# Patient Record
Sex: Female | Born: 1960 | Race: White | Hispanic: No | Marital: Married | Smoking: Former smoker
Health system: Southern US, Community
[De-identification: ages and names within clinical notes are randomized; demographics above are authoritative.]

## PROBLEM LIST (undated history)

## (undated) DIAGNOSIS — H269 Unspecified cataract: Secondary | ICD-10-CM

## (undated) DIAGNOSIS — J45909 Unspecified asthma, uncomplicated: Secondary | ICD-10-CM

## (undated) DIAGNOSIS — T753XXA Motion sickness, initial encounter: Secondary | ICD-10-CM

## (undated) DIAGNOSIS — M1990 Unspecified osteoarthritis, unspecified site: Secondary | ICD-10-CM

## (undated) DIAGNOSIS — T7840XA Allergy, unspecified, initial encounter: Secondary | ICD-10-CM

## (undated) HISTORY — DX: Allergy, unspecified, initial encounter: T78.40XA

## (undated) HISTORY — PX: HEMATOMA EVACUATION: SHX5118

## (undated) HISTORY — PX: EYE SURGERY: SHX253

## (undated) HISTORY — PX: NASAL SINUS SURGERY: SHX719

## (undated) HISTORY — DX: Unspecified cataract: H26.9

---

## 2011-05-13 ENCOUNTER — Ambulatory Visit: Payer: Self-pay | Admitting: Internal Medicine

## 2012-09-20 ENCOUNTER — Ambulatory Visit: Payer: Self-pay

## 2013-10-09 ENCOUNTER — Ambulatory Visit: Payer: Self-pay

## 2014-11-19 ENCOUNTER — Ambulatory Visit: Payer: Self-pay

## 2016-10-19 ENCOUNTER — Encounter: Payer: Self-pay | Admitting: Medical Oncology

## 2016-10-19 ENCOUNTER — Emergency Department
Admission: EM | Admit: 2016-10-19 | Discharge: 2016-10-19 | Disposition: A | Discharge status: Home / Self Care | Payer: BLUE CROSS/BLUE SHIELD | Attending: Emergency Medicine | Admitting: Emergency Medicine

## 2016-10-19 ENCOUNTER — Emergency Department: Payer: BLUE CROSS/BLUE SHIELD

## 2016-10-19 DIAGNOSIS — S93409A Sprain of unspecified ligament of unspecified ankle, initial encounter: Secondary | ICD-10-CM

## 2016-10-19 DIAGNOSIS — W1839XA Other fall on same level, initial encounter: Secondary | ICD-10-CM | POA: Diagnosis not present

## 2016-10-19 DIAGNOSIS — S93402A Sprain of unspecified ligament of left ankle, initial encounter: Secondary | ICD-10-CM | POA: Insufficient documentation

## 2016-10-19 DIAGNOSIS — Y9289 Other specified places as the place of occurrence of the external cause: Secondary | ICD-10-CM | POA: Insufficient documentation

## 2016-10-19 DIAGNOSIS — Y9389 Activity, other specified: Secondary | ICD-10-CM | POA: Diagnosis not present

## 2016-10-19 DIAGNOSIS — Y999 Unspecified external cause status: Secondary | ICD-10-CM | POA: Diagnosis not present

## 2016-10-19 DIAGNOSIS — S99912A Unspecified injury of left ankle, initial encounter: Secondary | ICD-10-CM | POA: Diagnosis present

## 2016-10-19 MED ORDER — NAPROXEN 500 MG PO TBEC: 500.0000 mg | DELAYED_RELEASE_TABLET | Freq: Two times a day (BID) | ORAL | 2 refills | Status: AC

## 2016-10-19 NOTE — ED Notes (Signed)
See triage note.. States she stepped off sidewalk  Twisted left ankle/foot couple of days ago..  States she is able to walk w/o diff  Positive pulses note

## 2016-10-19 NOTE — ED Triage Notes (Signed)
Pt twisted her left ankle Friday on a sidewalk, since then has been having pain and swelling.

## 2016-10-19 NOTE — ED Provider Notes (Signed)
[name]  CSN: [id number] Arrival date & time: 10/19/16  [id number]   History   Chief Complaint Chief Complaint  Patient presents with  . Ankle Pain    HPI  Alicia Huber is a 55 y.o. female presenting with 4/10 dull and burning left ankle pain along the 2nd through 5th metatarsals. 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 Denies previous trauma to ankle. Denies trauma to back. Aleve was used for relief. Denies aggravating factors. Patient states that she is currently experiencing "no pain".    No past surgical history on file.  OB History    No data available       Home Medications    Prior to Admission medications   Medication Sig Start Date End Date Taking? Authorizing Provider  naproxen (EC NAPROSYN) 500 MG EC tablet Take 1 tablet (500 mg total) by mouth 2 (two) times daily with a meal. 10/19/16 10/19/17  Lannie Fields, PA-C    Family History No family history on file.  Social History Social History  Substance Use Topics  . Smoking status: Not on file  . Smokeless tobacco: Not on file  . Alcohol use Not on file     Allergies   Codeine   Review of Systems Review of Systems Constitutional: No fever/chills Eyes: No visual changes. ENT: No sore throat. Cardiovascular: Denies chest pain. Respiratory: Denies shortness of breath. Gastrointestinal: No abdominal pain.  No nausea, no vomiting.  No diarrhea.  No constipation. Genitourinary: Negative for dysuria. Musculoskeletal: Negative for back pain. Skin: Negative for rash. Neurological: Negative focal weakness or numbness.  10-point ROS otherwise negative.  Physical Exam Updated Vital Signs BP 129/77 (BP Location: Left Arm)   Pulse 69   Temp 97.6 F (36.4 C) (Oral)   Resp 18   Ht 5\' 9"  (1.753 m)   Wt 108.9 kg   SpO2 97%   BMI 35.44 kg/m   Physical Exam  Constitutional: She is oriented to  person, place, and time. She appears well-developed and well-nourished.  HENT:  Head: Normocephalic and atraumatic.  Eyes: EOM are normal. Pupils are equal, round, and reactive to light.  Neck: Normal range of motion.  Cardiovascular: Normal rate, regular rhythm, normal heart sounds and intact distal pulses.   Pulmonary/Chest: Effort normal and breath sounds normal.  Musculoskeletal: Normal range of motion. She exhibits no edema.  Full flexion at left ankle. Patient experiences pain with extension at left ankle.Capable of inversion and eversion at left ankle. Moderate pain to palpation along the left 2nd through 5th metatarsals. Patient has intact sensation along L4, L5, S1 and S2 dermatomes.   Neurological: She is alert and oriented to person, place, and time. She displays normal reflexes. No cranial nerve deficit or sensory deficit. She exhibits normal muscle tone. Coordination normal.  Skin: Skin is warm and dry. Capillary refill takes less than 2 seconds. No rash noted. No erythema.  Psychiatric: She has a normal mood and affect. Her behavior is normal. Judgment and thought content normal.    ED Treatments / Results  Labs (all labs ordered are listed, but only abnormal results are displayed) Labs Reviewed - No data to display  EKG  EKG Interpretation None       Radiology Dg Foot Complete Left  Result Date: 10/19/2016 CLINICAL DATA:  Pain following fall EXAM: LEFT FOOT - COMPLETE 3+ VIEW COMPARISON:  None. FINDINGS: Frontal, oblique, and  lateral views were obtained. There is no fracture or dislocation. The joints appear normal. There is a bone island along the lateral aspect of the distal portion of the first proximal phalanx. There is a spur arising from the inferior calcaneus. There is an accessory ossicle lateral to the cuboid, an anatomic variant. IMPRESSION: Inferior calcaneal spur. No appreciable arthropathy. No fracture or dislocation. Electronically Signed   By: Lowella Grip III M.D.   On: 10/19/2016 09:29    Procedures Procedures (including critical care time)  Medications Ordered in ED Medications - No data to display   Initial Impression / Assessment and Plan / ED Course  I have reviewed the triage vital signs and the nursing notes.  Pertinent labs & imaging results that were available during my care of the patient were reviewed by me and considered in my medical decision making (see chart for details).  Clinical Course    Assessment: Left Ankle Sprain. Patient has moderate pain to palpation consistent with left ankle sprain. DG Foot Complete Left - no contributory findings.   Plan: Patient was advised to initiate short-term treatment with rest, ice, compression and elevation. Naproxen 500 mg orally twice daily prescribed for left ankle pain.   Final Clinical Impressions(s) / ED Diagnoses   Final diagnoses:  Sprain of ankle, unspecified laterality, unspecified ligament, initial encounter    New Prescriptions New Prescriptions   NAPROXEN (EC NAPROSYN) 500 MG EC TABLET    Take 1 tablet (500 mg total) by mouth 2 (two) times daily with a meal.     Lannie Fields, PA-C 10/19/16 1029    Earleen Newport, MD 10/19/16 1112

## 2016-12-25 ENCOUNTER — Ambulatory Visit: Payer: Self-pay | Admitting: Podiatry

## 2017-01-20 ENCOUNTER — Ambulatory Visit: Payer: BLUE CROSS/BLUE SHIELD | Attending: Oncology | Admitting: *Deleted

## 2017-01-20 ENCOUNTER — Ambulatory Visit
Admission: RE | Admit: 2017-01-20 | Discharge: 2017-01-20 | Disposition: A | Discharge status: Home / Self Care | Payer: BLUE CROSS/BLUE SHIELD | Source: Ambulatory Visit | Attending: Oncology | Admitting: Oncology

## 2017-01-20 VITALS — BP 129/66 | HR 67 | Temp 97.5°F | Resp 18 | Ht 69.69 in | Wt 224.4 lb

## 2017-01-20 DIAGNOSIS — Z0000 Encounter for general adult medical examination without abnormal findings: Secondary | ICD-10-CM

## 2017-01-20 NOTE — Patient Instructions (Signed)
HPV Test The human papillomavirus (HPV) test is used to look for high-risk types of HPV infection. HPV is a group of about 100 viruses. Many of these viruses cause growths on, in, or around the genitals. Most HPV viruses cause infections that usually go away without treatment. However, HPV types 6, 11, 16, and 18 are considered high-risk types of HPV that can increase your risk of cancer of the cervix or anus if the infection is left untreated. An HPV test identifies the DNA (genetic) strands of the HPV infection, so it is also referred to as the HPV DNA test. Although HPV is found in both males and females, the HPV test is only used to screen for increased cancer risk in females:  With an abnormal Pap test.  After treatment of an abnormal Pap test.  Between the ages of 30 and 65.  After treatment of a high-risk HPV infection. The HPV test may be done at the same time as a pelvic exam and Pap test in females over the age of 30. Both the HPV test and Pap test require a sample of cells from the cervix. How do I prepare for this test?  Do not douche or take a bath for 24-48 hours before the test or as directed by your health care provider.  Do not have sex for 24-48 hours before the test or as directed by your health care provider.  You may be asked to reschedule the test if you are menstruating.  You will be asked to urinate before the test. What do the results mean? It is your responsibility to obtain your test results. Ask the lab or department performing the test when and how you will get your results. Talk with your health care provider if you have any questions about your results. Your result will be negative or positive. Meaning of Negative Test Results  A negative HPV test result means that no HPV was found, and it is very likely that you do not have HPV. Meaning of Positive Test Results  A positive HPV test result indicates that you have HPV.  If your test result shows the presence  of any high-risk HPV strains, you may have an increased risk of developing cancer of the cervix or anus if the infection is left untreated.  If any low-risk HPV strains are found, you are not likely to have an increased risk of cancer. Discuss your test results with your health care provider. He or she will use the results to make a diagnosis and determine a treatment plan that is right for you. Talk with your health care provider to discuss your results, treatment options, and if necessary, the need for more tests. Talk with your health care provider if you have any questions about your results. This information is not intended to replace advice given to you by your health care provider. Make sure you discuss any questions you have with your health care provider. Document Released: 12/25/2004 Document Revised: 08/05/2016 Document Reviewed: 04/17/2014 Elsevier Interactive Patient Education  2017 Elsevier Inc.  Gave patient hand-out, Women Staying Healthy, Active and Well from [facility], with education on breast health, pap smears, heart and colon health.  

## 2017-01-20 NOTE — Progress Notes (Signed)
Subjective:     Patient ID: Alicia Huber, female   DOB: 1961/12/12, 56 y.o.   MRN: [id number]  HPI   Review of Systems     Objective:   Physical Exam  Pulmonary/Chest: Right breast exhibits no inverted nipple, no mass, no nipple discharge, no skin change and no tenderness. Left breast exhibits no mass, no nipple discharge, no skin change and no tenderness. Breasts are symmetrical.  Abdominal: There is no splenomegaly or hepatomegaly.  Genitourinary: Rectal exam shows no external hemorrhoid and no mass. No labial fusion. There is no rash, tenderness, lesion or injury on the right labia. There is no rash, tenderness, lesion or injury on the left labia. Cervix exhibits no motion tenderness, no discharge and no friability. Right adnexum displays no mass, no tenderness and no fullness. Left adnexum displays no mass, no tenderness and no fullness. No erythema, tenderness or bleeding in the vagina. No foreign body in the vagina. No signs of injury around the vagina. No vaginal discharge found.       Assessment:     56 year old White female returns to [facility] for annual screening.  Clinical breast exam unremarkable.  Taught self breast awareness.  Last pap was in 2013.  Specimen collected for pap smear.  Patient has been screened for eligibility.  She does not have any insurance, Medicare or Medicaid.  She also meets financial eligibility.  Hand-out given on the Affordable Care Act.     Plan:     Screening mammogram ordered.  Specimen for pap sent to the lab.  Will follow up per [facility] protocol.

## 2017-01-24 LAB — PAP LB AND HPV HIGH-RISK
HPV, high-risk: NEGATIVE
PAP SMEAR COMMENT: 0

## 2017-02-04 ENCOUNTER — Encounter: Payer: Self-pay | Admitting: *Deleted

## 2017-02-04 NOTE — Progress Notes (Signed)
Letter mailed to inform patient of her normal mammogram and pap smear.  She is to follow-up in one year with annual screening.  Next pap due in 5 years.  [facility] to [place].

## 2018-03-23 ENCOUNTER — Ambulatory Visit: Payer: Self-pay | Attending: Oncology

## 2018-03-23 ENCOUNTER — Ambulatory Visit
Admission: RE | Admit: 2018-03-23 | Discharge: 2018-03-23 | Disposition: A | Discharge status: Home / Self Care | Payer: Self-pay | Source: Ambulatory Visit | Attending: Oncology | Admitting: Oncology

## 2018-03-23 VITALS — BP 116/77 | HR 78 | Temp 97.2°F | Ht 69.0 in | Wt 227.0 lb

## 2018-03-23 DIAGNOSIS — Z0000 Encounter for general adult medical examination without abnormal findings: Secondary | ICD-10-CM

## 2018-03-23 NOTE — Progress Notes (Signed)
Letter mailed from [facility] to notify of normal mammogram results.  Patient to return in one year for annual screening.  Copy to [facility]. 

## 2018-03-23 NOTE — Progress Notes (Signed)
Subjective:     Patient ID: Alicia Huber, female   DOB: 1961-08-09, 57 y.o.   MRN: [id number]  HPI   Review of Systems     Objective:   Physical Exam     Assessment:     See note    Plan:     See note

## 2018-03-23 NOTE — Progress Notes (Signed)
Subjective:     Patient ID: Alicia Huber, female   DOB: 02/19/1961, 57 y.o.   MRN: [id number]  HPI   Review of Systems     Objective:   Physical Exam  Pulmonary/Chest: Right breast exhibits no inverted nipple, no mass, no nipple discharge, no skin change and no tenderness. Left breast exhibits no inverted nipple, no mass, no nipple discharge, no skin change and no tenderness. Breasts are symmetrical.       Assessment:     57 year old patient presents for [facility] clinic visit.  Patient screened, and meets [facility] eligibility.  Patient does not have insurance, Medicare or Medicaid.  Handout given on Affordable Care Act. Instructed patient on breast self awareness using teach back method.  CBE unremarkable.  Patient voices concerns about menopausal symptoms, vaginal dryness.  Given information on pelvic floor therapist, and recommendations for menopausal symptoms.     Plan:     Sent for bilateral screening mammogram.

## 2018-05-24 ENCOUNTER — Ambulatory Visit: Payer: Self-pay | Admitting: Podiatry

## 2018-06-08 ENCOUNTER — Ambulatory Visit: Payer: No Typology Code available for payment source | Admitting: Podiatry

## 2018-06-08 ENCOUNTER — Other Ambulatory Visit: Payer: Self-pay | Admitting: Podiatry

## 2018-06-08 ENCOUNTER — Encounter: Payer: Self-pay | Admitting: Podiatry

## 2018-06-08 ENCOUNTER — Telehealth: Payer: Self-pay | Admitting: *Deleted

## 2018-06-08 ENCOUNTER — Ambulatory Visit (INDEPENDENT_AMBULATORY_CARE_PROVIDER_SITE_OTHER): Payer: No Typology Code available for payment source

## 2018-06-08 VITALS — BP 121/74 | HR 73 | Resp 16

## 2018-06-08 DIAGNOSIS — M67472 Ganglion, left ankle and foot: Secondary | ICD-10-CM

## 2018-06-08 DIAGNOSIS — M6740 Ganglion, unspecified site: Secondary | ICD-10-CM

## 2018-06-08 DIAGNOSIS — M898X7 Other specified disorders of bone, ankle and foot: Secondary | ICD-10-CM

## 2018-06-08 NOTE — Telephone Encounter (Signed)
"  I just left Dr. Stephenie Acres office.  He told me to call you to schedule my surgery.  I'd like to do it in August."  I don't have your information at this time.  I don't know what procedures you will be having nor do I know how long your procedure will be.  He normally brings the information to me on Thursdays.  "I will call tomorrow.  I was just trying to get a date before he got filled up.  I have certain days I can do it on."  I'm sorry, I will know all information needed on tomorrow.

## 2018-06-08 NOTE — Progress Notes (Addendum)
  Subjective:  Patient ID: Alicia Huber, female    DOB: 1961-05-04,  MRN: [id number] HPI Twisted foot about a few weeks ago and noticed that there was a cyst that formed and has since become painful.  57 y.o. female presents with the above complaint.   ROS: Denies fever chills nausea vomiting muscle aches pains calf pain back pain chest pain shortness of breath.  No past medical history on file. No past surgical history on file.  Current Outpatient Medications:  .  albuterol (PROAIR HFA) 108 (90 Base) MCG/ACT inhaler, , Disp: , Rfl:  .  naproxen sodium (ALEVE) 220 MG tablet, Take 220 mg by mouth., Disp: , Rfl:   Allergies  Allergen Reactions  . Clarithromycin Other (See Comments) and Nausea Only  . Codeine Nausea Only   Review of Systems Objective:   Vitals:   06/08/18 0935  BP: 121/74  Pulse: 73  Resp: 16    General: Well developed, nourished, in no acute distress, alert and oriented x3   Dermatological: Skin is warm, dry and supple bilateral. Nails x 10 are well maintained; remaining integument appears unremarkable at this time. There are no open sores, no preulcerative lesions, no rash or signs of infection present.  Vascular: Dorsalis Pedis artery and Posterior Tibial artery pedal pulses are 2/4 bilateral with immedate capillary fill time. Pedal hair growth present. No varicosities and no lower extremity edema present bilateral.   Neruologic: Grossly intact via light touch bilateral. Vibratory intact via tuning fork bilateral. Protective threshold with Semmes Wienstein monofilament intact to all pedal sites bilateral. Patellar and Achilles deep tendon reflexes 2+ bilateral. No Babinski or clonus noted bilateral.   Musculoskeletal: No gross boney pedal deformities bilateral. No pain, crepitus, or limitation noted with foot and ankle range of motion bilateral. Muscular strength 5/5 in all groups tested bilateral. Painful firm cyst to the dorsum of the left foot at the  tarsometatarsal joint.   Gait: Unassisted, Nonantalgic.    Radiographs:  No acute findings  Assessment & Plan:   Assessment: Soft tissue mass or ganglion cyst dorsal lateral aspect of the fourth fifth tarsometatarsal joints.  Plan: Surgical consent was performed today thorough discussion.  Excision soft tissue tumor dorsal lateral aspect of the left foot we did discuss the possible postop complications which may include but not limited to postop pain bleeding swelling infection recurrence need further surgery overcorrection under correction loss of digit loss of limb loss of life.  She has a Cam walker at home she will bring that on the day of surgery dispensed both oral and written home-going instruction for preop surgery center as well as anesthesia group.  She signed all 3 pages a consent form of follow-up with her in the near future.     Elmin Wiederholt T. [place], [place]

## 2018-06-08 NOTE — Patient Instructions (Signed)
Pre-Operative Instructions  Congratulations, you have decided to take an important step towards improving your quality of life.  You can be assured that the doctors and staff at [facility] will be with you every step of the way.  Here are some important things you should know:  1. Plan to be at the surgery center/hospital at least 1 (one) hour prior to your scheduled time, unless otherwise directed by the surgical center/hospital staff.  You must have a responsible adult accompany you, remain during the surgery and drive you home.  Make sure you have directions to the surgical center/hospital to ensure you arrive on time. 2. If you are having surgery at [facility] or [facility] hospitals, you will need a copy of your medical history and physical form from your family physician within one month prior to the date of surgery. We will give you a form for your primary physician to complete.  3. We make every effort to accommodate the date you request for surgery.  However, there are times where surgery dates or times have to be moved.  We will contact you as soon as possible if a change in schedule is required.   4. No aspirin/ibuprofen for one week before surgery.  If you are on aspirin, any non-steroidal anti-inflammatory medications (Mobic, Aleve, Ibuprofen) should not be taken seven (7) days prior to your surgery.  You make take Tylenol for pain prior to surgery.  5. Medications - If you are taking daily heart and blood pressure medications, seizure, reflux, allergy, asthma, anxiety, pain or diabetes medications, make sure you notify the surgery center/hospital before the day of surgery so they can tell you which medications you should take or avoid the day of surgery. 6. No food or drink after midnight the night before surgery unless directed otherwise by surgical center/hospital staff. 7. No alcoholic beverages 24-hours prior to surgery.  No smoking 24-hours prior or 24-hours after  surgery. 8. Wear loose pants or shorts. They should be loose enough to fit over bandages, boots, and casts. 9. Don't wear slip-on shoes. Sneakers are preferred. 10. Bring your boot with you to the surgery center/hospital.  Also bring crutches or a walker if your physician has prescribed it for you.  If you do not have this equipment, it will be provided for you after surgery. 11. If you have not been contacted by the surgery center/hospital by the day before your surgery, call to confirm the date and time of your surgery. 12. Leave-time from work may vary depending on the type of surgery you have.  Appropriate arrangements should be made prior to surgery with your employer. 13. Prescriptions will be provided immediately following surgery by your doctor.  Fill these as soon as possible after surgery and take the medication as directed. Pain medications will not be refilled on weekends and must be approved by the doctor. 14. Remove nail polish on the operative foot and avoid getting pedicures prior to surgery. 15. Wash the night before surgery.  The night before surgery wash the foot and leg well with water and the antibacterial soap provided. Be sure to pay special attention to beneath the toenails and in between the toes.  Wash for at least three (3) minutes. Rinse thoroughly with water and dry well with a towel.  Perform this wash unless told not to do so by your physician.  Enclosed: 1 Ice pack (please put in freezer the night before surgery)   1 Hibiclens skin cleaner     Pre-op instructions  If you have any questions regarding the instructions, please do not hesitate to call our office.  [place]: [address], [place], [place] [address] -- [phone]  [place]: [address] [place], [place] [address] -- [phone]  [place]: [address]  , [place] [address] -- [phone]  [place]: [address], [address], [place], [place] [address] -- [phone]  Website:  [email or web address] 

## 2018-06-09 NOTE — Telephone Encounter (Signed)
I received your paperwork.  So I am calling you to schedule your surgery date.  Do you have a date in mind?  "I'd like to do it after my vacation to the beach.  I don't want to be in pain on my vacation.  Can he do it on August 16?"  Yes, that date is available.  I will get your surgery scheduled.  Someone from the surgical center will give you a call a day or two prior to your surgery.  They will give you your arrival time.  You can go on-line and register with the surgical center, instructions are in the brochure that was given to you.

## 2018-06-22 ENCOUNTER — Telehealth: Payer: Self-pay | Admitting: *Deleted

## 2018-06-22 NOTE — Telephone Encounter (Addendum)
"  I have a foot surgery scheduled on August 16.  I woke up about 3 am, the other morning and realized that the 16th, I'm out of town.  I'm supposed to be going out to meet up with my sister in [place], [place].  So I apologize greatly but I need to move the foot surgery because I already had the plane ticket scheduled since April.  I apologize for the oversight on my part.  You can reach me on my cell so we can reschedule the foot surgery."  I'm returning your call.  You wanted to reschedule your surgery?  "Yes, I feel so lame."  No worries, it happens to [place] all.  Do you have a date in mind that you would like to reschedule it to?  "What's his next available?"  He can do it the following week, August 23.  "That date will be fine.  Thank you so much."  I called and informed Caren Griffins, scheduler at the surgical center, of the change from 07/29/2018 to 08/05/2018.

## 2018-07-18 ENCOUNTER — Other Ambulatory Visit: Payer: Self-pay | Admitting: Student

## 2018-07-18 DIAGNOSIS — M79662 Pain in left lower leg: Secondary | ICD-10-CM

## 2018-07-26 ENCOUNTER — Ambulatory Visit
Admission: RE | Admit: 2018-07-26 | Discharge: 2018-07-26 | Disposition: A | Discharge status: Home / Self Care | Payer: No Typology Code available for payment source | Source: Ambulatory Visit | Attending: Student | Admitting: Student

## 2018-07-26 DIAGNOSIS — R9389 Abnormal findings on diagnostic imaging of other specified body structures: Secondary | ICD-10-CM | POA: Insufficient documentation

## 2018-07-26 DIAGNOSIS — M79662 Pain in left lower leg: Secondary | ICD-10-CM

## 2018-07-27 ENCOUNTER — Telehealth: Payer: Self-pay | Admitting: *Deleted

## 2018-07-27 NOTE — Telephone Encounter (Addendum)
"  I need my insurance checked for my surgery.  They said noone had contacted them yet."

## 2018-07-29 NOTE — Telephone Encounter (Signed)
I called informed the patient that she has a $5,500 deductible that started over on 07/14/2018.  She said she was aware.  She said she does not know how much she's going to owe for the surgical center nor for the anesthesia.  I asked her if she had called the surgical center to get an estimate.  She said she does not feel like she should have to call around to get this information herself.  She said she's doing all the leg work.  She said we don't want to see her ugly side come out. I told her I would call the surgical center and asked them to call her with an estimate.    I called the surgical center and spoke to [facility].  I informed her that the patient wanted an estimate for the facility fee and for anesthesia.  Renee said she would have someone call her today with that information.

## 2018-08-01 NOTE — Telephone Encounter (Signed)
Patients postop appointments have been canceled. °

## 2018-08-01 NOTE — Telephone Encounter (Signed)
"  I'm scheduled for surgery on Friday, August 23.  I need to cancel it.  I was not aware that my deductible was starting over in August.  I don't have the money right now.  I will probably have to have surgery sometime next year.  I will call back to reschedule later."  I will let Dr. Milinda Pointer know and I will call the surgical center to cancel it.

## 2018-08-10 ENCOUNTER — Encounter: Payer: No Typology Code available for payment source | Admitting: Podiatry

## 2018-08-17 ENCOUNTER — Encounter: Payer: No Typology Code available for payment source | Admitting: Podiatry

## 2019-01-04 ENCOUNTER — Ambulatory Visit (INDEPENDENT_AMBULATORY_CARE_PROVIDER_SITE_OTHER): Payer: No Typology Code available for payment source

## 2019-01-04 ENCOUNTER — Encounter: Payer: Self-pay | Admitting: Podiatry

## 2019-01-04 ENCOUNTER — Ambulatory Visit (INDEPENDENT_AMBULATORY_CARE_PROVIDER_SITE_OTHER): Payer: No Typology Code available for payment source | Admitting: Podiatry

## 2019-01-04 DIAGNOSIS — M722 Plantar fascial fibromatosis: Secondary | ICD-10-CM | POA: Diagnosis not present

## 2019-01-04 MED ORDER — MELOXICAM 15 MG PO TABS: 15.0000 mg | ORAL_TABLET | Freq: Every day | ORAL | 3 refills | Status: DC

## 2019-01-04 MED ORDER — METHYLPREDNISOLONE 4 MG PO TBPK: ORAL_TABLET | ORAL | 0 refills | Status: DC

## 2019-01-04 NOTE — Progress Notes (Signed)
She presents today chief complaint of plantar and lateral right heel pain states is been burning for the past month or so she says the pain is constant she has been wearing inserts and a night splint.  She states that really has not helped.  Objective: Vital signs are stable alert and oriented x3.  Pulses are palpable.  He has no pain on medial lateral compression of the calcaneus that she does have pain with direct palpation medial [place] tubercle of the right heel.  Assessment: Plantar fasciitis.  Plan: Extra Betadine skin prep I injected the medial aspect of the right heel with 20 mg Kenalog 5 mg Marcaine point maximal tenderness.  Start her on methylprednisolone to be followed by meloxicam.  Placed her in plantar fascial brace.  Follow-up with her in 1 month

## 2019-01-04 NOTE — Patient Instructions (Signed)

## 2019-02-01 ENCOUNTER — Ambulatory Visit: Payer: No Typology Code available for payment source | Admitting: Podiatry

## 2019-05-30 ENCOUNTER — Other Ambulatory Visit: Payer: Self-pay | Admitting: Student

## 2019-05-30 DIAGNOSIS — Z1231 Encounter for screening mammogram for malignant neoplasm of breast: Secondary | ICD-10-CM

## 2019-06-08 ENCOUNTER — Other Ambulatory Visit: Payer: Self-pay

## 2019-06-08 ENCOUNTER — Ambulatory Visit
Admission: RE | Admit: 2019-06-08 | Discharge: 2019-06-08 | Disposition: A | Discharge status: Home / Self Care | Payer: No Typology Code available for payment source | Source: Ambulatory Visit | Attending: Student | Admitting: Student

## 2019-06-08 ENCOUNTER — Encounter (INDEPENDENT_AMBULATORY_CARE_PROVIDER_SITE_OTHER): Payer: Self-pay

## 2019-06-08 DIAGNOSIS — Z1231 Encounter for screening mammogram for malignant neoplasm of breast: Secondary | ICD-10-CM

## 2020-03-08 ENCOUNTER — Ambulatory Visit: Payer: No Typology Code available for payment source

## 2020-03-18 ENCOUNTER — Other Ambulatory Visit: Payer: Self-pay

## 2020-03-18 ENCOUNTER — Ambulatory Visit: Payer: No Typology Code available for payment source | Attending: Internal Medicine

## 2020-03-18 DIAGNOSIS — Z23 Encounter for immunization: Secondary | ICD-10-CM

## 2020-03-18 NOTE — Progress Notes (Signed)
   Covid-19 Vaccination Clinic  Name:  TEODORA BASIC    MRN: [id number] DOB: 1961/06/17  03/18/2020  Ms. Matecki was observed post Covid-19 immunization for 15 minutes without incident. She was provided with Vaccine Information Sheet and instruction to access the V-Safe system.   Ms. Takach was instructed to call 911 with any severe reactions post vaccine: Marland Kitchen Difficulty breathing  . Swelling of face and throat  . A fast heartbeat  . A bad rash all over body  . Dizziness and weakness   Immunizations Administered    Name Date Dose VIS Date Route   Pfizer COVID-19 Vaccine 03/18/2020 10:33 AM 0.3 mL 11/24/2019 Intramuscular   Manufacturer: [place]   Lot: [id number]   [place]: [id number]

## 2020-04-10 ENCOUNTER — Ambulatory Visit: Payer: No Typology Code available for payment source | Attending: Internal Medicine

## 2020-04-10 ENCOUNTER — Other Ambulatory Visit: Payer: Self-pay

## 2020-04-10 DIAGNOSIS — Z23 Encounter for immunization: Secondary | ICD-10-CM

## 2020-04-10 NOTE — Progress Notes (Signed)
   Covid-19 Vaccination Clinic  Name:  Alicia Huber    MRN: [id number] DOB: 04/03/1961  04/10/2020  Alicia Huber was observed post Covid-19 immunization for 15 minutes without incident. She was provided with Vaccine Information Sheet and instruction to access the V-Safe system.   Alicia Huber was instructed to call 911 with any severe reactions post vaccine: Marland Kitchen Difficulty breathing  . Swelling of face and throat  . A fast heartbeat  . A bad rash all over body  . Dizziness and weakness   Immunizations Administered    Name Date Dose VIS Date Route   Pfizer COVID-19 Vaccine 04/10/2020  1:01 PM 0.3 mL 02/07/2019 Intramuscular   Manufacturer: [place]   Lot: [id number]   [place]: [id number]

## 2020-07-23 ENCOUNTER — Other Ambulatory Visit: Payer: Self-pay | Admitting: Student

## 2020-07-23 DIAGNOSIS — Z1231 Encounter for screening mammogram for malignant neoplasm of breast: Secondary | ICD-10-CM

## 2020-08-05 ENCOUNTER — Other Ambulatory Visit: Payer: Self-pay

## 2020-08-05 ENCOUNTER — Ambulatory Visit
Admission: RE | Admit: 2020-08-05 | Discharge: 2020-08-05 | Disposition: A | Discharge status: Home / Self Care | Payer: No Typology Code available for payment source | Source: Ambulatory Visit | Attending: Student | Admitting: Student

## 2020-08-05 DIAGNOSIS — Z1231 Encounter for screening mammogram for malignant neoplasm of breast: Secondary | ICD-10-CM | POA: Insufficient documentation

## 2021-04-14 ENCOUNTER — Ambulatory Visit (INDEPENDENT_AMBULATORY_CARE_PROVIDER_SITE_OTHER): Payer: No Typology Code available for payment source | Admitting: Dermatology

## 2021-04-14 ENCOUNTER — Other Ambulatory Visit: Payer: Self-pay

## 2021-04-14 DIAGNOSIS — L820 Inflamed seborrheic keratosis: Secondary | ICD-10-CM | POA: Diagnosis not present

## 2021-04-14 DIAGNOSIS — L309 Dermatitis, unspecified: Secondary | ICD-10-CM | POA: Diagnosis not present

## 2021-04-14 DIAGNOSIS — D492 Neoplasm of unspecified behavior of bone, soft tissue, and skin: Secondary | ICD-10-CM | POA: Diagnosis not present

## 2021-04-14 NOTE — Progress Notes (Signed)
   Follow-Up Visit   Subjective  Alicia Huber is a 60 y.o. female who presents for the following: Seborrheic Dermatitis (Vs Atopic derm eyelids, ears, Eucrisa prn) and check spot (L lower lip, noticed it back ~61m ago has been txted in past with LN2, last txted 08/2019).   The following portions of the chart were reviewed this encounter and updated as appropriate:       Review of Systems:  No other skin or systemic complaints except as noted in HPI or Assessment and Plan.  Objective  Well appearing patient in no apparent distress; mood and affect are within normal limits.  A focused examination was performed including face, ears. Relevant physical exam findings are noted in the Assessment and Plan.  Objective  L lower lip at vermillion: 1.35mm red slightly depressed macule with surrounding hypopigmentation, no scale     Objective  R medial upper eyelid x 1: Erythematous keratotic or waxy stuck-on papule   Objective  lat canthus: Pink scaliness right and left canthus   Assessment & Plan  Neoplasm of skin L lower lip at vermillion  Hemangioma vs other, discussed bx, pt prefers to observe since hasn't bled or grown larger.  Recheck on f/up  Inflamed seborrheic keratosis R medial upper eyelid x 1  Prior to procedure, discussed risks of blister formation, small wound, skin dyspigmentation, or rare scar following cryotherapy.    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   I, Othelia Pulling, RMA, am acting as scribe for Brendolyn Patty, MD . Documentation: I have reviewed the above documentation for accuracy and completeness, and I agree with the above.  Brendolyn Patty MD

## 2021-04-14 NOTE — Patient Instructions (Addendum)
If you have any questions or concerns for your doctor, please call our main line at [phone] and press option 4 to reach your doctor's medical assistant. If no one answers, please leave a voicemail as directed and we will return your call as soon as possible. Messages left after 4 pm will be answered the following business day.   You may also send [place] a message via [place]. We typically respond to MyChart messages within 1-2 business days.  For prescription refills, please ask your pharmacy to contact our office. Our fax number is [phone].  If you have an urgent issue when the clinic is closed that cannot wait until the next business day, you can page your doctor at the number below.    Please note that while we do our best to be available for urgent issues outside of office hours, we are not available 24/7.   If you have an urgent issue and are unable to reach [place], you may choose to seek medical care at your doctor's office, retail clinic, urgent care center, or emergency room.  If you have a medical emergency, please immediately call 911 or go to the emergency department.  Pager Numbers  - Dr. Nehemiah Massed: [phone]  - Dr. Laurence Ferrari: [phone]  - Dr. Nicole Kindred: [phone]  In the event of inclement weather, please call our main line at [phone] for an update on the status of any delays or closures.  Dermatology Medication Tips: Please keep the boxes that topical medications come in in order to help keep track of the instructions about where and how to use these. Pharmacies typically print the medication instructions only on the boxes and not directly on the medication tubes.   If your medication is too expensive, please contact our office at [phone] option 4 or send [place] a message through [place].   We are unable to tell what your co-pay for medications will be in advance as this is different depending on your insurance coverage. However, we may be able to find a substitute  medication at lower cost or fill out paperwork to get insurance to cover a needed medication.   If a prior authorization is required to get your medication covered by your insurance company, please allow [place] 1-2 business days to complete this process.  Drug prices often vary depending on where the prescription is filled and some pharmacies may offer cheaper prices.  The website www.goodrx.com contains coupons for medications through different pharmacies. The prices here do not account for what the cost may be with help from insurance (it may be cheaper with your insurance), but the website can give you the price if you did not use any insurance.  - You can print the associated coupon and take it with your prescription to the pharmacy.  - You may also stop by our office during regular business hours and pick up a GoodRx coupon card.  - If you need your prescription sent electronically to a different pharmacy, notify our office through [facility] or by phone at [phone] option 4.    For corner of eyes Start Ketoconazole 2% nightly at bedtime Start Eucrisa ointment 1 to times a day when flared  Seborrheic Keratosis  What causes seborrheic keratoses? Seborrheic keratoses are harmless, common skin growths that first appear during adult life.  As time goes by, more growths appear.  Some people may develop a large number of them.  Seborrheic keratoses appear on both covered and uncovered body parts.  They are  not caused by sunlight.  The tendency to develop seborrheic keratoses can be inherited.  They vary in color from skin-colored to gray, brown, or even black.  They can be either smooth or have a rough, warty surface.   Seborrheic keratoses are superficial and look as if they were stuck on the skin.  Under the microscope this type of keratosis looks like layers upon layers of skin.  That is why at times the top layer may seem to fall off, but the rest of the growth remains and re-grows.     Treatment Seborrheic keratoses do not need to be treated, but can easily be removed in the office.  Seborrheic keratoses often cause symptoms when they rub on clothing or jewelry.  Lesions can be in the way of shaving.  If they become inflamed, they can cause itching, soreness, or burning.  Removal of a seborrheic keratosis can be accomplished by freezing, burning, or surgery. If any spot bleeds, scabs, or grows rapidly, please return to have it checked, as these can be an indication of a skin cancer.

## 2021-05-29 ENCOUNTER — Encounter: Payer: Self-pay | Admitting: Ophthalmology

## 2021-06-11 ENCOUNTER — Encounter
Admission: RE | Disposition: A | Discharge status: Home / Self Care | Payer: Self-pay | Source: Ambulatory Visit | Attending: Ophthalmology

## 2021-06-11 ENCOUNTER — Ambulatory Visit: Payer: No Typology Code available for payment source | Admitting: Anesthesiology

## 2021-06-11 ENCOUNTER — Other Ambulatory Visit: Payer: Self-pay

## 2021-06-11 ENCOUNTER — Encounter: Payer: Self-pay | Admitting: Ophthalmology

## 2021-06-11 ENCOUNTER — Ambulatory Visit
Admission: RE | Admit: 2021-06-11 | Discharge: 2021-06-11 | Disposition: A | Discharge status: Home / Self Care | Payer: No Typology Code available for payment source | Source: Ambulatory Visit | Attending: Ophthalmology | Admitting: Ophthalmology

## 2021-06-11 DIAGNOSIS — H2512 Age-related nuclear cataract, left eye: Secondary | ICD-10-CM | POA: Insufficient documentation

## 2021-06-11 DIAGNOSIS — Z87891 Personal history of nicotine dependence: Secondary | ICD-10-CM | POA: Insufficient documentation

## 2021-06-11 DIAGNOSIS — Z885 Allergy status to narcotic agent status: Secondary | ICD-10-CM | POA: Diagnosis not present

## 2021-06-11 DIAGNOSIS — Z881 Allergy status to other antibiotic agents status: Secondary | ICD-10-CM | POA: Insufficient documentation

## 2021-06-11 DIAGNOSIS — Z79899 Other long term (current) drug therapy: Secondary | ICD-10-CM | POA: Diagnosis not present

## 2021-06-11 HISTORY — DX: Unspecified asthma, uncomplicated: J45.909

## 2021-06-11 HISTORY — PX: CATARACT EXTRACTION W/PHACO: SHX586

## 2021-06-11 HISTORY — DX: Motion sickness, initial encounter: T75.3XXA

## 2021-06-11 HISTORY — DX: Unspecified osteoarthritis, unspecified site: M19.90

## 2021-06-11 SURGERY — PHACOEMULSIFICATION, CATARACT, WITH IOL INSERTION
Anesthesia: Monitor Anesthesia Care | Site: Eye | Laterality: Left

## 2021-06-11 MED ORDER — PHENYLEPHRINE HCL 10 % OP SOLN
1.0000 [drp] | OPHTHALMIC | Status: DC | PRN
  Administered 2021-06-11 (×3): 1 [drp] via OPHTHALMIC

## 2021-06-11 MED ORDER — CEFUROXIME OPHTHALMIC INJECTION 1 MG/0.1 ML
INJECTION | OPHTHALMIC | Status: DC | PRN
  Administered 2021-06-11: 0.1 mL via INTRACAMERAL

## 2021-06-11 MED ORDER — BRIMONIDINE TARTRATE-TIMOLOL 0.2-0.5 % OP SOLN
OPHTHALMIC | Status: DC | PRN
  Administered 2021-06-11: 1 [drp] via OPHTHALMIC

## 2021-06-11 MED ORDER — CYCLOPENTOLATE HCL 2 % OP SOLN
1.0000 [drp] | OPHTHALMIC | Status: DC | PRN
  Administered 2021-06-11 (×3): 1 [drp] via OPHTHALMIC

## 2021-06-11 MED ORDER — MIDAZOLAM HCL 2 MG/2ML IJ SOLN
INTRAMUSCULAR | Status: DC | PRN
  Administered 2021-06-11: .5 mg via INTRAVENOUS
  Administered 2021-06-11: 1 mg via INTRAVENOUS

## 2021-06-11 MED ORDER — TETRACAINE HCL 0.5 % OP SOLN
1.0000 [drp] | OPHTHALMIC | Status: DC | PRN
  Administered 2021-06-11 (×3): 1 [drp] via OPHTHALMIC

## 2021-06-11 MED ORDER — SIGHTPATH DOSE#1 BSS IO SOLN
INTRAOCULAR | Status: DC | PRN
  Administered 2021-06-11: 63 mL via OPHTHALMIC

## 2021-06-11 MED ORDER — SIGHTPATH DOSE#1 NA HYALUR & NA CHOND-NA HYALUR IO KIT
PACK | INTRAOCULAR | Status: DC | PRN
  Administered 2021-06-11: 1 via OPHTHALMIC

## 2021-06-11 MED ORDER — LIDOCAINE HCL (PF) 2 % IJ SOLN
INTRAOCULAR | Status: DC | PRN
  Administered 2021-06-11: 1 mL

## 2021-06-11 MED ORDER — FENTANYL CITRATE (PF) 100 MCG/2ML IJ SOLN
INTRAMUSCULAR | Status: DC | PRN
  Administered 2021-06-11: 25 ug via INTRAVENOUS
  Administered 2021-06-11: 50 ug via INTRAVENOUS

## 2021-06-11 MED ORDER — LACTATED RINGERS IV SOLN: INTRAVENOUS | Status: DC

## 2021-06-11 SURGICAL SUPPLY — 17 items
CANNULA ANT/CHMB 27GA (MISCELLANEOUS) ×2 IMPLANT
GLOVE SURG ENC TEXT LTX SZ7.5 (GLOVE) ×2 IMPLANT
GLOVE SURG TRIUMPH 8.0 PF LTX (GLOVE) ×2 IMPLANT
GOWN STRL REUS W/ TWL LRG LVL3 (GOWN DISPOSABLE) ×2 IMPLANT
GOWN STRL REUS W/TWL LRG LVL3 (GOWN DISPOSABLE) ×4
LENS ACRYSOF TORIC TRIFOC 21.0 ×2 IMPLANT
LENS IOL ACRSF IQ PAN 21.0 ×1 IMPLANT
MARKER SKIN DUAL TIP RULER LAB (MISCELLANEOUS) ×2 IMPLANT
NEEDLE CAPSULORHEX 25GA (NEEDLE) ×2 IMPLANT
NEEDLE FILTER BLUNT 18X 1/2SAF (NEEDLE) ×2
NEEDLE FILTER BLUNT 18X1 1/2 (NEEDLE) ×2 IMPLANT
PACK EYE AFTER SURG (MISCELLANEOUS) ×2 IMPLANT
SYR 3ML LL SCALE MARK (SYRINGE) ×4 IMPLANT
SYR TB 1ML LUER SLIP (SYRINGE) ×2 IMPLANT
TIP ITREPID SGL USE BENT I/A (SUCTIONS) ×2 IMPLANT
WATER STERILE IRR 250ML POUR (IV SOLUTION) ×2 IMPLANT
WIPE NON LINTING 3.25X3.25 (MISCELLANEOUS) ×2 IMPLANT

## 2021-06-11 NOTE — Anesthesia Postprocedure Evaluation (Signed)
Anesthesia Post Note  Patient: Alicia Huber  Procedure(s) Performed: CATARACT EXTRACTION PHACO AND INTRAOCULAR LENS PLACEMENT (IOC) LEFT PANOPTIX LENS 1.54 00:29.6 (Left: Eye)     Patient location during evaluation: PACU Anesthesia Type: MAC Level of consciousness: awake and alert Pain management: pain level controlled Vital Signs Assessment: post-procedure vital signs reviewed and stable Respiratory status: spontaneous breathing, nonlabored ventilation, respiratory function stable and patient connected to nasal cannula oxygen Cardiovascular status: stable and blood pressure returned to baseline Postop Assessment: no apparent nausea or vomiting Anesthetic complications: no   No notable events documented.  Fidel Levy

## 2021-06-11 NOTE — Anesthesia Procedure Notes (Signed)
Procedure Name: MAC Date/Time: 06/11/2021 1:00 PM Performed by: Vanetta Shawl, CRNA Pre-anesthesia Checklist: Patient identified, Emergency Drugs available, Suction available, Timeout performed and Patient being monitored Patient Re-evaluated:Patient Re-evaluated prior to induction Oxygen Delivery Method: Nasal cannula Placement Confirmation: positive ETCO2

## 2021-06-11 NOTE — Op Note (Signed)
  OPERATIVE NOTE  Alicia Huber [id number] 06/11/2021   PREOPERATIVE DIAGNOSIS:  Nuclear sclerotic cataract left eye. H25.12   POSTOPERATIVE DIAGNOSIS:    Nuclear sclerotic cataract left eye.     PROCEDURE:  Phacoemusification with posterior chamber intraocular lens placement of the left eye  Ultrasound time: Procedure(s): CATARACT EXTRACTION PHACO AND INTRAOCULAR LENS PLACEMENT (IOC) LEFT PANOPTIX LENS (Left) 0:30 CDE1.54  LENS:   Implant Name Type Inv. Item Serial No. Manufacturer Lot No. LRB No. Used Action  ACRYSOF TORIC TRIFOCAL 21.0 - [id number]  ACRYSOF TORIC TRIFOCAL 21.0 [id number] ALCON  Left 1 Implanted    TFNT00 21.0 D  SURGEON:  Wyonia Hough, MD   ANESTHESIA:  Topical with tetracaine drops and 2% Xylocaine jelly, augmented with 1% preservative-free intracameral lidocaine.    COMPLICATIONS:  None.   DESCRIPTION OF PROCEDURE:  The patient was identified in the holding room and transported to the operating room and placed in the supine position under the operating microscope.  The left eye was identified as the operative eye and it was prepped and draped in the usual sterile ophthalmic fashion.   A 1 millimeter clear-corneal paracentesis was made at the 1:30 position.  0.5 ml of preservative-free 1% lidocaine was injected into the anterior chamber.  The anterior chamber was filled with Viscoat viscoelastic.  A 2.4 millimeter keratome was used to make a near-clear corneal incision at the 10:30 position.  .  A curvilinear capsulorrhexis was made with a cystotome and capsulorrhexis forceps.  Balanced salt solution was used to hydrodissect and hydrodelineate the nucleus.   Phacoemulsification was then used in stop and chop fashion to remove the lens nucleus and epinucleus.  The remaining cortex was then removed using the irrigation and aspiration handpiece. Provisc was then placed into the capsular bag to distend it for lens placement.  A lens was then injected  into the capsular bag.  The remaining viscoelastic was aspirated.   Wounds were hydrated with balanced salt solution.  The anterior chamber was inflated to a physiologic pressure with balanced salt solution.  No wound leaks were noted. Cefuroxime 0.1 ml of a 10mg /ml solution was injected into the anterior chamber for a dose of 1 mg of intracameral antibiotic at the completion of the case.   Timolol and Brimonidine drops were applied to the eye.  The patient was taken to the recovery room in stable condition without complications of anesthesia or surgery.  [name] 06/11/2021, 1:20 PM

## 2021-06-11 NOTE — H&P (Signed)
[facility]   Primary Care Physician:  Patient, No Pcp Per (Inactive) Ophthalmologist: Dr. Leandrew Koyanagi  Pre-Procedure History & Physical: HPI:  Alicia Huber is a 60 y.o. female here for ophthalmic surgery.   Past Medical History:  Diagnosis Date   Arthritis    childhood   Asthma    Car sickness     Past Surgical History:  Procedure Laterality Date   HEMATOMA EVACUATION     from thigh   NASAL SINUS SURGERY      Prior to Admission medications   Medication Sig Start Date End Date Taking? Authorizing Provider  albuterol (VENTOLIN HFA) 108 (90 Base) MCG/ACT inhaler  06/14/14  Yes [provider]  Fexofenadine HCl (MUCINEX ALLERGY PO) Take by mouth as needed.   Yes [provider]  ketoconazole (NIZORAL) 2 % cream Apply 1 application topically daily as needed for irritation.   Yes [provider]  loratadine-pseudoephedrine (CLARITIN-D 24-HOUR) 10-240 MG 24 hr tablet Take 1 tablet by mouth as needed for allergies.   Yes [provider]  naproxen sodium (ALEVE) 220 MG tablet Take 220 mg by mouth.   Yes [provider]  triamcinolone (NASACORT) 55 MCG/ACT AERO nasal inhaler Place 2 sprays into the nose as needed.   Yes [provider]  meloxicam (MOBIC) 15 MG tablet Take 1 tablet (15 mg total) by mouth daily. Patient not taking: Reported on 05/29/2021 01/04/19   Tyson Dense T, DPM  methylPREDNISolone (MEDROL DOSEPAK) 4 MG TBPK tablet 6 day dose pack - take as directed Patient not taking: Reported on 05/29/2021 01/04/19   Garrel Ridgel, DPM  TOBRADEX ophthalmic ointment APLY A SMALL AMOUNT TO SKIN TWICE DAILY FOR 7 DAYS Patient not taking: Reported on 05/29/2021 11/08/18   [provider]    Allergies as of 05/07/2021 - Review Complete 04/14/2021  Allergen Reaction Noted   Clarithromycin Other (See Comments) and Nausea Only 05/08/2015   Codeine Nausea Only 05/08/2015    Family History  Problem Relation  Age of Onset   Breast cancer Paternal Grandmother     Social History   Socioeconomic History   Marital status: Married    Spouse name: Not on file   Number of children: Not on file   Years of education: Not on file   Highest education level: Not on file  Occupational History   Not on file  Tobacco Use   Smoking status: Former    Pack years: 0.00   Smokeless tobacco: Never  Substance and Sexual Activity   Alcohol use: Yes    Alcohol/week: 14.0 standard drinks    Types: 14 Standard drinks or equivalent per week   Drug use: Not on file   Sexual activity: Not on file  Other Topics Concern   Not on file  Social History Narrative   Not on file   Social Determinants of Health   Financial Resource Strain: Not on file  Food Insecurity: Not on file  Transportation Needs: Not on file  Physical Activity: Not on file  Stress: Not on file  Social Connections: Not on file  Intimate Partner Violence: Not on file    Review of Systems: See HPI, otherwise negative ROS  Physical Exam: BP (!) 139/91   Pulse 62   Temp 97.9 F (36.6 C) (Temporal)   Resp 16   Ht 5\' 9"  (1.753 m)   Wt 97.5 kg   SpO2 99%   BMI 31.75 kg/m  General:   Alert,  pleasant and cooperative in NAD Head:  Normocephalic and atraumatic. Lungs:  Clear to auscultation.    Heart:  Regular rate and rhythm.   Impression/Plan: Alicia Huber is here for ophthalmic surgery.  Risks, benefits, limitations, and alternatives regarding ophthalmic surgery have been reviewed with the patient.  Questions have been answered.  All parties agreeable.   Leandrew Koyanagi, MD  06/11/2021, 12:26 PM

## 2021-06-11 NOTE — Transfer of Care (Signed)
Immediate Anesthesia Transfer of Care Note  Patient: Alicia Huber  Procedure(s) Performed: CATARACT EXTRACTION PHACO AND INTRAOCULAR LENS PLACEMENT (IOC) LEFT PANOPTIX LENS (Left)  Patient Location: PACU  Anesthesia Type: MAC  Level of Consciousness: awake, alert  and patient cooperative  Airway and Oxygen Therapy: Patient Spontanous Breathing   Post-op Assessment: Post-op Vital signs reviewed, Patient's Cardiovascular Status Stable, Respiratory Function Stable, Patent Airway and No signs of Nausea or vomiting  Post-op Vital Signs: Reviewed and stable  Complications: No notable events documented.

## 2021-06-11 NOTE — Anesthesia Preprocedure Evaluation (Signed)
Anesthesia Evaluation  Patient identified by MRN, date of birth, ID band Patient awake    Reviewed: NPO status   History of Anesthesia Complications Negative for: history of anesthetic complications  Airway Mallampati: II  TM Distance: >3 FB Neck ROM: full    Dental no notable dental hx.    Pulmonary asthma , former smoker,    Pulmonary exam normal        Cardiovascular Exercise Tolerance: Good negative cardio ROS Normal cardiovascular exam     Neuro/Psych negative neurological ROS  negative psych ROS   GI/Hepatic negative GI ROS, Neg liver ROS,   Endo/Other  Morbid obesity (bmi 32)  Renal/GU negative Renal ROS  negative genitourinary   Musculoskeletal  (+) Arthritis ,   Abdominal   Peds  Hematology negative hematology ROS (+)   Anesthesia Other Findings   Reproductive/Obstetrics                            Anesthesia Physical Anesthesia Plan  ASA: 2  Anesthesia Plan: MAC   Post-op Pain Management:    Induction:   PONV Risk Score and Plan:   Airway Management Planned:   Additional Equipment:   Intra-op Plan:   Post-operative Plan:   Informed Consent: I have reviewed the patients History and Physical, chart, labs and discussed the procedure including the risks, benefits and alternatives for the proposed anesthesia with the patient or authorized representative who has indicated his/her understanding and acceptance.       Plan Discussed with: CRNA  Anesthesia Plan Comments:         Anesthesia Quick Evaluation

## 2021-06-17 ENCOUNTER — Encounter: Payer: Self-pay | Admitting: Ophthalmology

## 2021-06-24 ENCOUNTER — Encounter: Payer: Self-pay | Admitting: Dermatology

## 2021-06-24 ENCOUNTER — Ambulatory Visit: Payer: No Typology Code available for payment source | Admitting: Dermatology

## 2021-06-24 NOTE — Anesthesia Preprocedure Evaluation (Addendum)
Anesthesia Evaluation  Patient identified by MRN, date of birth, ID band Patient awake    Reviewed: Allergy & Precautions, NPO status , Patient's Chart, lab work & pertinent test results  History of Anesthesia Complications Negative for: history of anesthetic complications  Airway Mallampati: III   Neck ROM: Full    Dental no notable dental hx.    Pulmonary asthma , former smoker (age 60),    Pulmonary exam normal breath sounds clear to auscultation       Cardiovascular Exercise Tolerance: Good negative cardio ROS Normal cardiovascular exam Rhythm:Regular Rate:Normal     Neuro/Psych negative neurological ROS     GI/Hepatic negative GI ROS,   Endo/Other  Obesity   Renal/GU negative Renal ROS     Musculoskeletal  (+) Arthritis ,   Abdominal   Peds  Hematology negative hematology ROS (+)   Anesthesia Other Findings   Reproductive/Obstetrics                            Anesthesia Physical Anesthesia Plan  ASA: 2  Anesthesia Plan: MAC   Post-op Pain Management:    Induction: Intravenous  PONV Risk Score and Plan: 2 and TIVA, Midazolam and Treatment may vary due to age or medical condition  Airway Management Planned: Nasal Cannula  Additional Equipment:   Intra-op Plan:   Post-operative Plan:   Informed Consent: I have reviewed the patients History and Physical, chart, labs and discussed the procedure including the risks, benefits and alternatives for the proposed anesthesia with the patient or authorized representative who has indicated his/her understanding and acceptance.       Plan Discussed with: CRNA  Anesthesia Plan Comments:        Anesthesia Quick Evaluation

## 2021-06-25 ENCOUNTER — Other Ambulatory Visit: Payer: Self-pay

## 2021-06-25 ENCOUNTER — Ambulatory Visit: Payer: No Typology Code available for payment source | Admitting: Anesthesiology

## 2021-06-25 ENCOUNTER — Ambulatory Visit
Admission: RE | Admit: 2021-06-25 | Discharge: 2021-06-25 | Disposition: A | Discharge status: Home / Self Care | Payer: No Typology Code available for payment source | Source: Ambulatory Visit | Attending: Ophthalmology | Admitting: Ophthalmology

## 2021-06-25 ENCOUNTER — Encounter
Admission: RE | Disposition: A | Discharge status: Home / Self Care | Payer: Self-pay | Source: Ambulatory Visit | Attending: Ophthalmology

## 2021-06-25 ENCOUNTER — Encounter: Payer: Self-pay | Admitting: Ophthalmology

## 2021-06-25 DIAGNOSIS — Z881 Allergy status to other antibiotic agents status: Secondary | ICD-10-CM | POA: Insufficient documentation

## 2021-06-25 DIAGNOSIS — Z79899 Other long term (current) drug therapy: Secondary | ICD-10-CM | POA: Insufficient documentation

## 2021-06-25 DIAGNOSIS — Z87891 Personal history of nicotine dependence: Secondary | ICD-10-CM | POA: Insufficient documentation

## 2021-06-25 DIAGNOSIS — Z885 Allergy status to narcotic agent status: Secondary | ICD-10-CM | POA: Insufficient documentation

## 2021-06-25 DIAGNOSIS — H2511 Age-related nuclear cataract, right eye: Secondary | ICD-10-CM | POA: Insufficient documentation

## 2021-06-25 HISTORY — PX: CATARACT EXTRACTION W/PHACO: SHX586

## 2021-06-25 SURGERY — PHACOEMULSIFICATION, CATARACT, WITH IOL INSERTION
Anesthesia: Monitor Anesthesia Care | Site: Eye | Laterality: Right

## 2021-06-25 MED ORDER — SIGHTPATH DOSE#1 NA HYALUR & NA CHOND-NA HYALUR IO KIT
PACK | INTRAOCULAR | Status: DC | PRN
  Administered 2021-06-25: 1 via OPHTHALMIC

## 2021-06-25 MED ORDER — LACTATED RINGERS IV SOLN: INTRAVENOUS | Status: DC

## 2021-06-25 MED ORDER — BRIMONIDINE TARTRATE-TIMOLOL 0.2-0.5 % OP SOLN
OPHTHALMIC | Status: DC | PRN
  Administered 2021-06-25: 1 [drp] via OPHTHALMIC

## 2021-06-25 MED ORDER — FENTANYL CITRATE (PF) 100 MCG/2ML IJ SOLN
INTRAMUSCULAR | Status: DC | PRN
  Administered 2021-06-25 (×2): 50 ug via INTRAVENOUS

## 2021-06-25 MED ORDER — CEFUROXIME OPHTHALMIC INJECTION 1 MG/0.1 ML
INJECTION | OPHTHALMIC | Status: DC | PRN
  Administered 2021-06-25: 0.1 mL via INTRACAMERAL

## 2021-06-25 MED ORDER — MIDAZOLAM HCL 2 MG/2ML IJ SOLN
INTRAMUSCULAR | Status: DC | PRN
  Administered 2021-06-25: 2 mg via INTRAVENOUS

## 2021-06-25 MED ORDER — PHENYLEPHRINE HCL 10 % OP SOLN
1.0000 [drp] | OPHTHALMIC | Status: DC | PRN
  Administered 2021-06-25 (×3): 1 [drp] via OPHTHALMIC

## 2021-06-25 MED ORDER — LIDOCAINE HCL (PF) 2 % IJ SOLN
INTRAOCULAR | Status: DC | PRN
  Administered 2021-06-25: 1 mL

## 2021-06-25 MED ORDER — CYCLOPENTOLATE HCL 2 % OP SOLN
1.0000 [drp] | OPHTHALMIC | Status: DC | PRN
  Administered 2021-06-25 (×3): 1 [drp] via OPHTHALMIC

## 2021-06-25 MED ORDER — ONDANSETRON HCL 4 MG/2ML IJ SOLN: 4.0000 mg | Freq: Once | INTRAMUSCULAR | Status: DC | PRN

## 2021-06-25 MED ORDER — ACETAMINOPHEN 325 MG PO TABS: 650.0000 mg | ORAL_TABLET | Freq: Once | ORAL | Status: DC | PRN

## 2021-06-25 MED ORDER — TETRACAINE HCL 0.5 % OP SOLN
1.0000 [drp] | OPHTHALMIC | Status: DC | PRN
  Administered 2021-06-25 (×3): 1 [drp] via OPHTHALMIC

## 2021-06-25 MED ORDER — ACETAMINOPHEN 160 MG/5ML PO SOLN: 325.0000 mg | ORAL | Status: DC | PRN

## 2021-06-25 MED ORDER — SIGHTPATH DOSE#1 BSS IO SOLN
INTRAOCULAR | Status: DC | PRN
  Administered 2021-06-25: 67 mL via OPHTHALMIC

## 2021-06-25 SURGICAL SUPPLY — 16 items
CANNULA ANT/CHMB 27GA (MISCELLANEOUS) ×2 IMPLANT
GLOVE SURG ENC TEXT LTX SZ7.5 (GLOVE) ×2 IMPLANT
GLOVE SURG TRIUMPH 8.0 PF LTX (GLOVE) ×2 IMPLANT
GOWN STRL REUS W/ TWL LRG LVL3 (GOWN DISPOSABLE) ×2 IMPLANT
GOWN STRL REUS W/TWL LRG LVL3 (GOWN DISPOSABLE) ×4
LENS ACRYSOF TORIC TRIFOC 20.5 ×2 IMPLANT
LENS IOL ACRSF IQ PAN 20.5 ×1 IMPLANT
MARKER SKIN DUAL TIP RULER LAB (MISCELLANEOUS) ×2 IMPLANT
NEEDLE CAPSULORHEX 25GA (NEEDLE) ×2 IMPLANT
NEEDLE FILTER BLUNT 18X 1/2SAF (NEEDLE) ×2
NEEDLE FILTER BLUNT 18X1 1/2 (NEEDLE) ×2 IMPLANT
PACK EYE AFTER SURG (MISCELLANEOUS) ×2 IMPLANT
SYR 3ML LL SCALE MARK (SYRINGE) ×4 IMPLANT
SYR TB 1ML LUER SLIP (SYRINGE) ×2 IMPLANT
WATER STERILE IRR 250ML POUR (IV SOLUTION) ×2 IMPLANT
WIPE NON LINTING 3.25X3.25 (MISCELLANEOUS) ×2 IMPLANT

## 2021-06-25 NOTE — Op Note (Signed)
  LOCATION:  [place]   PREOPERATIVE DIAGNOSIS:    Nuclear sclerotic cataract right eye. H25.11   POSTOPERATIVE DIAGNOSIS:  Nuclear sclerotic cataract right eye.     PROCEDURE:  Phacoemusification with posterior chamber intraocular lens placement of the right eye   ULTRASOUND TIME: Procedure(s): CATARACT EXTRACTION PHACO AND INTRAOCULAR LENS PLACEMENT (IOC) RIGHT PANOPTIX LENS 9.34 01:04.3 (Right)  LENS:   Implant Name Type Inv. Item Serial No. Manufacturer Lot No. LRB No. Used Action  ACRYSOF TORIC TRIFOCAL 20.5 - [id number]  ACRYSOF TORIC TRIFOCAL 20.5 [id number] ALCON  Right 1 Implanted      TFNT00 Panoptix 20.5   SURGEON:  Wyonia Hough, MD   ANESTHESIA:  Topical with tetracaine drops and 2% Xylocaine jelly, augmented with 1% preservative-free intracameral lidocaine.    COMPLICATIONS:  None.   DESCRIPTION OF PROCEDURE:  The patient was identified in the holding room and transported to the operating room and placed in the supine position under the operating microscope.  The right eye was identified as the operative eye and it was prepped and draped in the usual sterile ophthalmic fashion.   A 1 millimeter clear-corneal paracentesis was made at the 12:00 position.  0.5 ml of preservative-free 1% lidocaine was injected into the anterior chamber. The anterior chamber was filled with Viscoat viscoelastic.  A 2.4 millimeter keratome was used to make a near-clear corneal incision at the 9:00 position.  A curvilinear capsulorrhexis was made with a cystotome and capsulorrhexis forceps.  Balanced salt solution was used to hydrodissect and hydrodelineate the nucleus.   Phacoemulsification was then used in stop and chop fashion to remove the lens nucleus and epinucleus.  The remaining cortex was then removed using the irrigation and aspiration handpiece. Provisc was then placed into the capsular bag to distend it for lens placement.  A lens was then injected into the  capsular bag.  The remaining viscoelastic was aspirated.   Wounds were hydrated with balanced salt solution.  The anterior chamber was inflated to a physiologic pressure with balanced salt solution.  No wound leaks were noted. Cefuroxime 0.1 ml of a 10mg /ml solution was injected into the anterior chamber for a dose of 1 mg of intracameral antibiotic at the completion of the case.   Timolol and Brimonidine drops were applied to the eye.  The patient was taken to the recovery room in stable condition without complications of anesthesia or surgery.   Syan Cullimore 06/25/2021, 12:23 PM

## 2021-06-25 NOTE — Anesthesia Procedure Notes (Signed)
Procedure Name: MAC Date/Time: 06/25/2021 12:06 PM Performed by: Jeannene Patella, CRNA Pre-anesthesia Checklist: Patient identified, Emergency Drugs available, Suction available, Timeout performed and Patient being monitored Patient Re-evaluated:Patient Re-evaluated prior to induction Oxygen Delivery Method: Nasal cannula Placement Confirmation: positive ETCO2

## 2021-06-25 NOTE — Anesthesia Postprocedure Evaluation (Signed)
Anesthesia Post Note  Patient: Alicia Huber  Procedure(s) Performed: CATARACT EXTRACTION PHACO AND INTRAOCULAR LENS PLACEMENT (IOC) RIGHT PANOPTIX LENS 9.34 01:04.3 (Right: Eye)     Patient location during evaluation: PACU Anesthesia Type: MAC Level of consciousness: awake and alert, oriented and patient cooperative Pain management: pain level controlled Vital Signs Assessment: post-procedure vital signs reviewed and stable Respiratory status: spontaneous breathing, nonlabored ventilation and respiratory function stable Cardiovascular status: blood pressure returned to baseline and stable Postop Assessment: adequate PO intake Anesthetic complications: no   No notable events documented.  Darrin Nipper

## 2021-06-25 NOTE — H&P (Signed)
[facility]   Primary Care Physician:  Patient, No Pcp Per (Inactive) Ophthalmologist: Dr. Leandrew Koyanagi  Pre-Procedure History & Physical: HPI:  Alicia Huber is a 60 y.o. female here for ophthalmic surgery.   Past Medical History:  Diagnosis Date   Arthritis    childhood   Asthma    Car sickness     Past Surgical History:  Procedure Laterality Date   HEMATOMA EVACUATION     from thigh   NASAL SINUS SURGERY      Prior to Admission medications   Medication Sig Start Date End Date Taking? Authorizing Provider  Fexofenadine HCl (MUCINEX ALLERGY PO) Take by mouth as needed.   Yes [provider]  ketoconazole (NIZORAL) 2 % cream Apply 1 application topically daily as needed for irritation.   Yes [provider]  loratadine-pseudoephedrine (CLARITIN-D 24-HOUR) 10-240 MG 24 hr tablet Take 1 tablet by mouth as needed for allergies.   Yes [provider]  naproxen sodium (ALEVE) 220 MG tablet Take 220 mg by mouth.   Yes [provider]  triamcinolone (NASACORT) 55 MCG/ACT AERO nasal inhaler Place 2 sprays into the nose as needed.   Yes [provider]  albuterol (VENTOLIN HFA) 108 (90 Base) MCG/ACT inhaler  06/14/14   [provider]  meloxicam (MOBIC) 15 MG tablet Take 1 tablet (15 mg total) by mouth daily. Patient not taking: No sig reported 01/04/19   Hyatt, Max T, DPM  methylPREDNISolone (MEDROL DOSEPAK) 4 MG TBPK tablet 6 day dose pack - take as directed Patient not taking: No sig reported 01/04/19   Hyatt, Max T, DPM  TOBRADEX ophthalmic ointment APLY A SMALL AMOUNT TO SKIN TWICE DAILY FOR 7 DAYS Patient not taking: No sig reported 11/08/18   [provider]    Allergies as of 05/07/2021 - Review Complete 04/14/2021  Allergen Reaction Noted   Clarithromycin Other (See Comments) and Nausea Only 05/08/2015   Codeine Nausea Only 05/08/2015    Family History  Problem Relation Age of Onset   Breast  cancer Paternal Grandmother     Social History   Socioeconomic History   Marital status: Married    Spouse name: Not on file   Number of children: Not on file   Years of education: Not on file   Highest education level: Not on file  Occupational History   Not on file  Tobacco Use   Smoking status: Former    Pack years: 0.00   Smokeless tobacco: Never  Substance and Sexual Activity   Alcohol use: Yes    Alcohol/week: 14.0 standard drinks    Types: 14 Standard drinks or equivalent per week   Drug use: Not on file   Sexual activity: Not on file  Other Topics Concern   Not on file  Social History Narrative   Not on file   Social Determinants of Health   Financial Resource Strain: Not on file  Food Insecurity: Not on file  Transportation Needs: Not on file  Physical Activity: Not on file  Stress: Not on file  Social Connections: Not on file  Intimate Partner Violence: Not on file    Review of Systems: See HPI, otherwise negative ROS  Physical Exam: BP 134/75   Pulse 67   Temp (!) 97.5 F (36.4 C) (Temporal)   Resp 18   Ht 5\' 9"  (1.753 m)   Wt 96.6 kg   SpO2 96%   BMI 31.45 kg/m  General:   Alert,  pleasant and cooperative in NAD Head:  Normocephalic and atraumatic. Lungs:  Clear to auscultation.    Heart:  Regular rate and rhythm.   Impression/Plan: Alicia Huber is here for ophthalmic surgery.  Risks, benefits, limitations, and alternatives regarding ophthalmic surgery have been reviewed with the patient.  Questions have been answered.  All parties agreeable.   Leandrew Koyanagi, MD  06/25/2021, 11:00 AM

## 2021-06-25 NOTE — Transfer of Care (Signed)
Immediate Anesthesia Transfer of Care Note  Patient: [name]  Procedure(s) Performed: CATARACT EXTRACTION PHACO AND INTRAOCULAR LENS PLACEMENT (IOC) RIGHT PANOPTIX LENS 9.34 01:04.3 (Right: Eye)  Patient Location: PACU  Anesthesia Type: MAC  Level of Consciousness: awake, alert  and patient cooperative  Airway and Oxygen Therapy: Patient Spontanous Breathing and Patient connected to supplemental oxygen  Post-op Assessment: Post-op Vital signs reviewed, Patient's Cardiovascular Status Stable, Respiratory Function Stable, Patent Airway and No signs of Nausea or vomiting  Post-op Vital Signs: Reviewed and stable  Complications: No notable events documented.

## 2021-06-30 ENCOUNTER — Encounter: Payer: Self-pay | Admitting: Ophthalmology

## 2021-09-15 ENCOUNTER — Ambulatory Visit (INDEPENDENT_AMBULATORY_CARE_PROVIDER_SITE_OTHER): Payer: No Typology Code available for payment source | Admitting: Dermatology

## 2021-09-15 ENCOUNTER — Other Ambulatory Visit: Payer: Self-pay

## 2021-09-15 DIAGNOSIS — D1801 Hemangioma of skin and subcutaneous tissue: Secondary | ICD-10-CM

## 2021-09-15 DIAGNOSIS — L570 Actinic keratosis: Secondary | ICD-10-CM | POA: Diagnosis not present

## 2021-09-15 DIAGNOSIS — L219 Seborrheic dermatitis, unspecified: Secondary | ICD-10-CM

## 2021-09-15 DIAGNOSIS — L821 Other seborrheic keratosis: Secondary | ICD-10-CM

## 2021-09-15 DIAGNOSIS — L409 Psoriasis, unspecified: Secondary | ICD-10-CM | POA: Diagnosis not present

## 2021-09-15 MED ORDER — CLOBETASOL PROPIONATE 0.05 % EX CREA: 1.0000 "application " | TOPICAL_CREAM | CUTANEOUS | 1 refills | Status: DC

## 2021-09-15 MED ORDER — MOMETASONE FUROATE 0.1 % EX SOLN: CUTANEOUS | 1 refills | Status: AC

## 2021-09-15 MED ORDER — KETOCONAZOLE 2 % EX CREA: 1.0000 "application " | TOPICAL_CREAM | CUTANEOUS | 2 refills | Status: AC

## 2021-09-15 NOTE — Patient Instructions (Signed)

## 2021-09-15 NOTE — Progress Notes (Signed)
Follow-Up Visit   Subjective  Alicia Huber is a 60 y.o. female who presents for the following: Hemangioma vs other (L lower lip at vermillion), check spot (L eyelid, R shoulder), dermatitis (Lat canthus, ketoconazole 2% cr, eucrisa oint prn), and Seborrheic Dermatitis (Ears, dermotic oil).   The following portions of the chart were reviewed this encounter and updated as appropriate:       Review of Systems:  No other skin or systemic complaints except as noted in HPI or Assessment and Plan.  Objective  Well appearing patient in no apparent distress; mood and affect are within normal limits.  A focused examination was performed including face, ears, shoulder. Relevant physical exam findings are noted in the Assessment and Plan.  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  It tends to be exacerbated by stress and cooler weather.  People who have neurologic disease may experience new onset or exacerbation of existing seborrheic dermatitis.  The condition is not curable but treatable and can be controlled.  D/C Dermotic oil Start Mometasone lotion qd/bid to ears prn flares Cont Eucrisa oint qd/bid to L lat canthus prn flares Cont Ketoconazole 2% cr qd aa face/ears prn flares  Topical steroids (such as triamcinolone, fluocinolone, fluocinonide, mometasone,  clobetasol, halobetasol, betamethasone, hydrocortisone) can cause thinning and lightening of the skin if they are used for too long in the same area. Your physician has selected the right strength medicine for your problem and area affected on the body. Please use your medication only as directed by your physician to prevent side effects.    mometasone (ELOCON) 0.1 % lotion - bil ears, lat canthus Apply topically as directed. Qd to bid ears prn itchy rash  ketoconazole (NIZORAL) 2 % cream - bil ears, lat canthus Apply 1 application topically as directed. Qd aa face prn flares  AK (actinic keratosis) (6) L malar cheek x 1, vertex scalp x 5  Vs ISKs  Destruction of lesion - L malar cheek x 1, vertex scalp x 5  Destruction method: cryotherapy   Informed consent: discussed and consent obtained   Lesion destroyed using liquid nitrogen: Yes   Region frozen until ice ball extended beyond lesion: Yes   Outcome: patient tolerated procedure well with no complications   Post-procedure details: wound care instructions given   Additional details:  Prior to procedure, discussed risks of blister formation, small wound, skin dyspigmentation, or rare scar following cryotherapy. Recommend Vaseline ointment to treated areas while healing.   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 Caution atrophy with long-term use.   clobetasol cream (TEMOVATE) 0.05 % - L hip, post thigh Apply 1 application topically as directed. Qd to bid aa itchy rash on legs until clear, then prn  flares, avoid face, groin, axilla  Seborrheic Keratoses - Waxy flesh pap, 3.68mm - Benign-appearing - Discussed benign etiology and prognosis. - Observe - Call for any changes - L upper eyelid - R shoulder  Return in about 6 months (around 03/16/2022) for Aks, Seb derm.  I, Othelia Pulling, RMA, am acting as scribe for Brendolyn Patty, MD  .  Documentation: I have reviewed the above documentation for accuracy and completeness, and I agree with the above.  Brendolyn Patty MD

## 2021-09-17 ENCOUNTER — Other Ambulatory Visit: Payer: Self-pay | Admitting: Student

## 2021-09-17 DIAGNOSIS — Z1231 Encounter for screening mammogram for malignant neoplasm of breast: Secondary | ICD-10-CM

## 2021-10-02 ENCOUNTER — Other Ambulatory Visit: Payer: Self-pay

## 2021-10-02 ENCOUNTER — Ambulatory Visit
Admission: RE | Admit: 2021-10-02 | Discharge: 2021-10-02 | Disposition: A | Discharge status: Home / Self Care | Payer: No Typology Code available for payment source | Source: Ambulatory Visit | Attending: Student | Admitting: Student

## 2021-10-02 DIAGNOSIS — Z1231 Encounter for screening mammogram for malignant neoplasm of breast: Secondary | ICD-10-CM | POA: Diagnosis not present

## 2021-10-07 ENCOUNTER — Telehealth: Payer: Self-pay

## 2021-10-07 NOTE — Telephone Encounter (Signed)
Pt. Calling to schedule colonoscopy 

## 2021-10-08 ENCOUNTER — Other Ambulatory Visit: Payer: Self-pay

## 2021-10-08 DIAGNOSIS — Z1211 Encounter for screening for malignant neoplasm of colon: Secondary | ICD-10-CM

## 2021-10-08 MED ORDER — PEG 3350-KCL-NA BICARB-NACL 420 G PO SOLR: 4000.0000 mL | Freq: Once | ORAL | 0 refills | Status: AC

## 2021-10-08 NOTE — Telephone Encounter (Signed)
Procedure scheduled for 10/30/21.

## 2021-10-08 NOTE — Progress Notes (Signed)
Gastroenterology Pre-Procedure Review  Request Date: 10/30/21 Requesting Physician: Dr. Vicente Males  PATIENT REVIEW QUESTIONS: The patient responded to the following health history questions as indicated:    1. Are you having any GI issues? no 2. Do you have a personal history of Polyps? no 3. Do you have a family history of Colon Cancer or Polyps? no 4. Diabetes Mellitus? no 5. Joint replacements in the past 12 months?no 6. Major health problems in the past 3 months?no 7. Any artificial heart valves, MVP, or defibrillator?no    MEDICATIONS & ALLERGIES:    Patient reports the following regarding taking any anticoagulation/antiplatelet therapy:   Plavix, Coumadin, Eliquis, Xarelto, Lovenox, Pradaxa, Brilinta, or Effient? no Aspirin? no  Patient confirms/reports the following medications:  Current Outpatient Medications  Medication Sig Dispense Refill   albuterol (VENTOLIN HFA) 108 (90 Base) MCG/ACT inhaler  (Patient not taking: Reported on 06/17/2021)     clobetasol cream (TEMOVATE) 1.65 % Apply 1 application topically as directed. Qd to bid aa itchy rash on legs until clear, then prn flares, avoid face, groin, axilla 60 g 1   Fexofenadine HCl (MUCINEX ALLERGY PO) Take by mouth as needed.     ketoconazole (NIZORAL) 2 % cream Apply 1 application topically daily as needed for irritation.     ketoconazole (NIZORAL) 2 % cream Apply 1 application topically as directed. Qd aa face prn flares 60 g 2   loratadine-pseudoephedrine (CLARITIN-D 24-HOUR) 10-240 MG 24 hr tablet Take 1 tablet by mouth as needed for allergies.     meloxicam (MOBIC) 15 MG tablet Take 1 tablet (15 mg total) by mouth daily. (Patient not taking: No sig reported) 30 tablet 3   methylPREDNISolone (MEDROL DOSEPAK) 4 MG TBPK tablet 6 day dose pack - take as directed (Patient not taking: No sig reported) 21 tablet 0   mometasone (ELOCON) 0.1 % lotion Apply topically as directed. Qd to bid ears prn itchy rash 60 mL 1   naproxen sodium  (ALEVE) 220 MG tablet Take 220 mg by mouth.     TOBRADEX ophthalmic ointment APLY A SMALL AMOUNT TO SKIN TWICE DAILY FOR 7 DAYS (Patient not taking: No sig reported)     triamcinolone (NASACORT) 55 MCG/ACT AERO nasal inhaler Place 2 sprays into the nose as needed.     No current facility-administered medications for this visit.    Patient confirms/reports the following allergies:  Allergies  Allergen Reactions   Clarithromycin Other (See Comments) and Nausea Only   Codeine Nausea Only    No orders of the defined types were placed in this encounter.   AUTHORIZATION INFORMATION Primary Insurance: 1D#: Group #:  Secondary Insurance: 1D#: Group #:  SCHEDULE INFORMATION: Date: 10/30/21 Time: Location: [place]

## 2021-10-24 IMAGING — MG MM DIGITAL SCREENING BILAT W/ TOMO AND CAD
6 of 12 series · 6 of 36 positions shown · non-contrast
Comparison: Previous exam(s).

CLINICAL DATA: Screening.

EXAM:
DIGITAL SCREENING BILATERAL MAMMOGRAM WITH TOMOSYNTHESIS AND CAD
TECHNIQUE: Bilateral screening digital craniocaudal and mediolateral oblique
mammograms were obtained. Bilateral screening digital breast
tomosynthesis was performed. The images were evaluated with
computer-aided detection.

[L MLO synth-2D (1 of 2)]
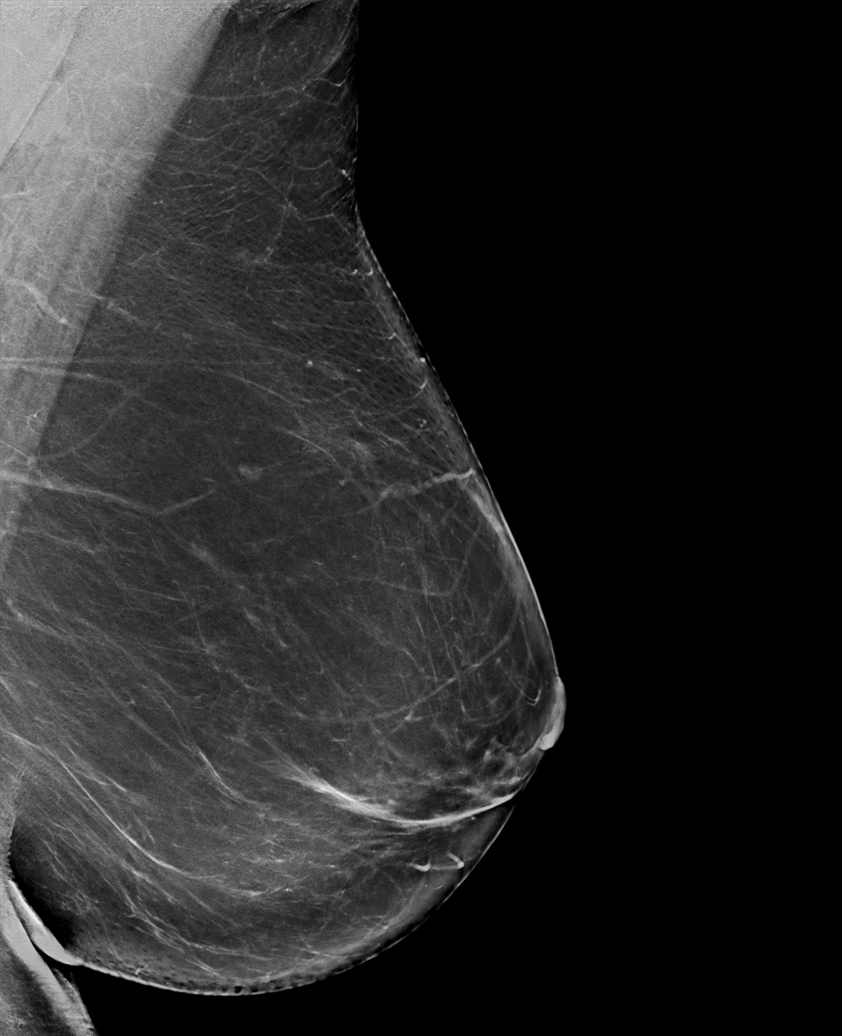

[L MLO synth-2D (2 of 2)]
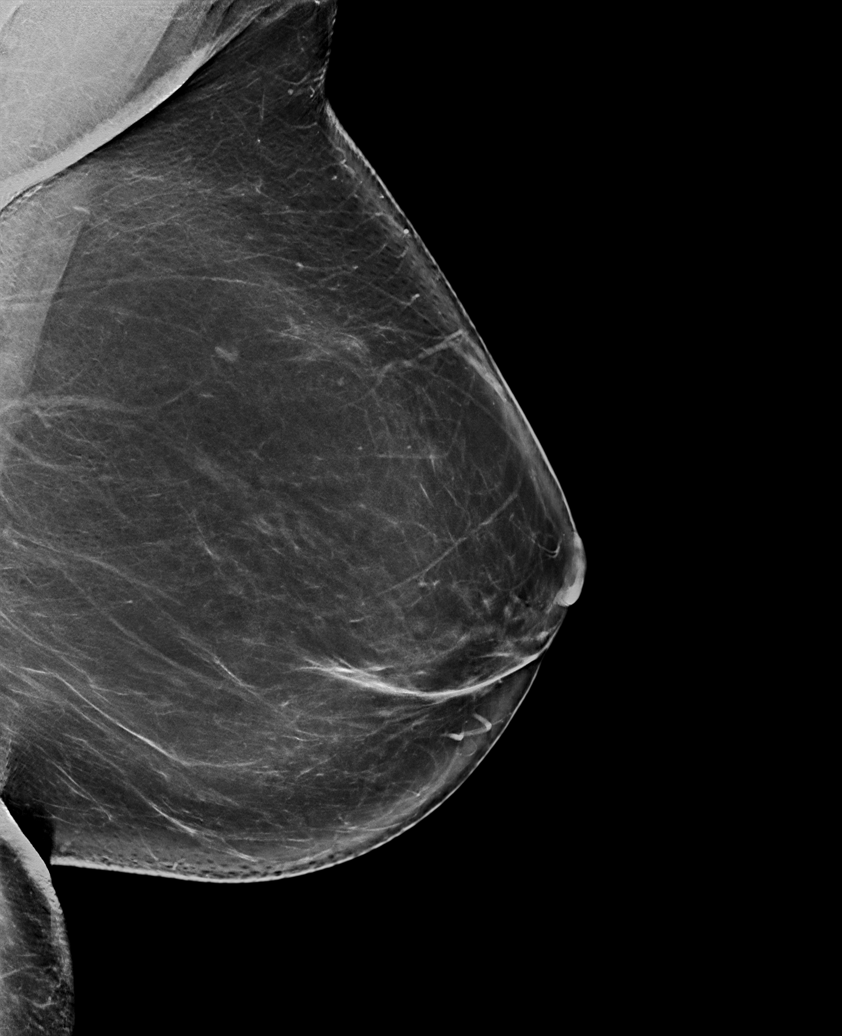

[R CC synth-2D (1 of 2)]
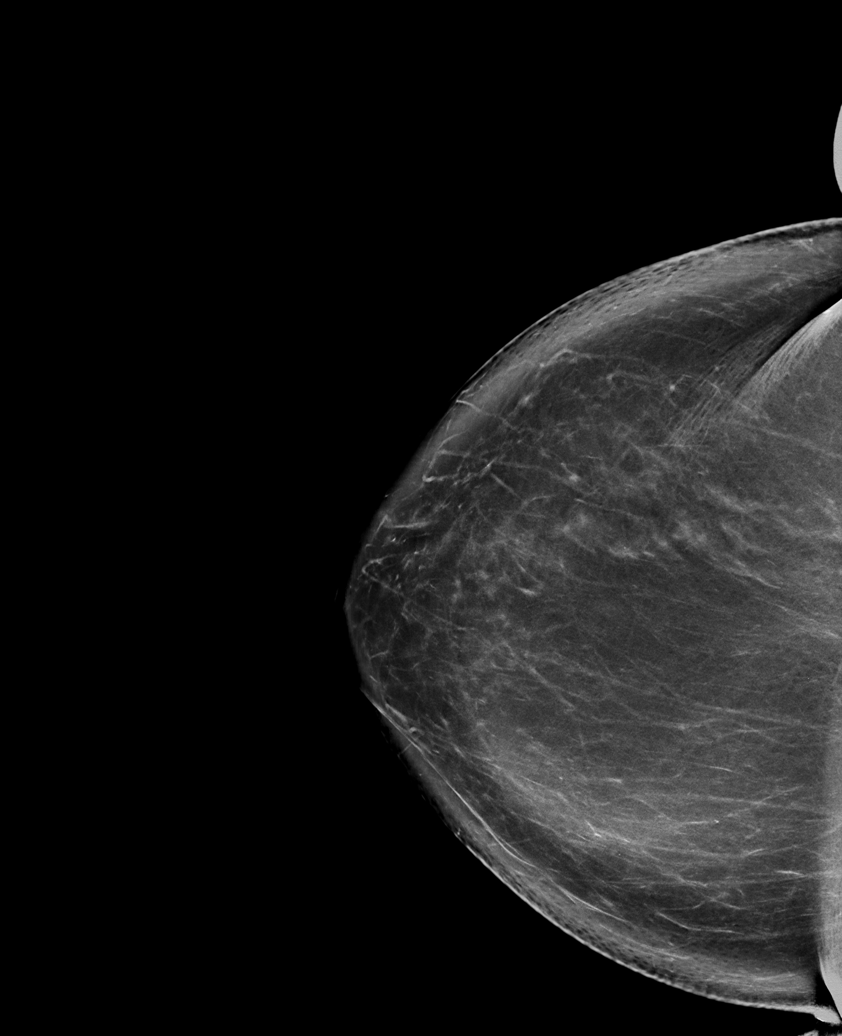

[R CC synth-2D (2 of 2)]
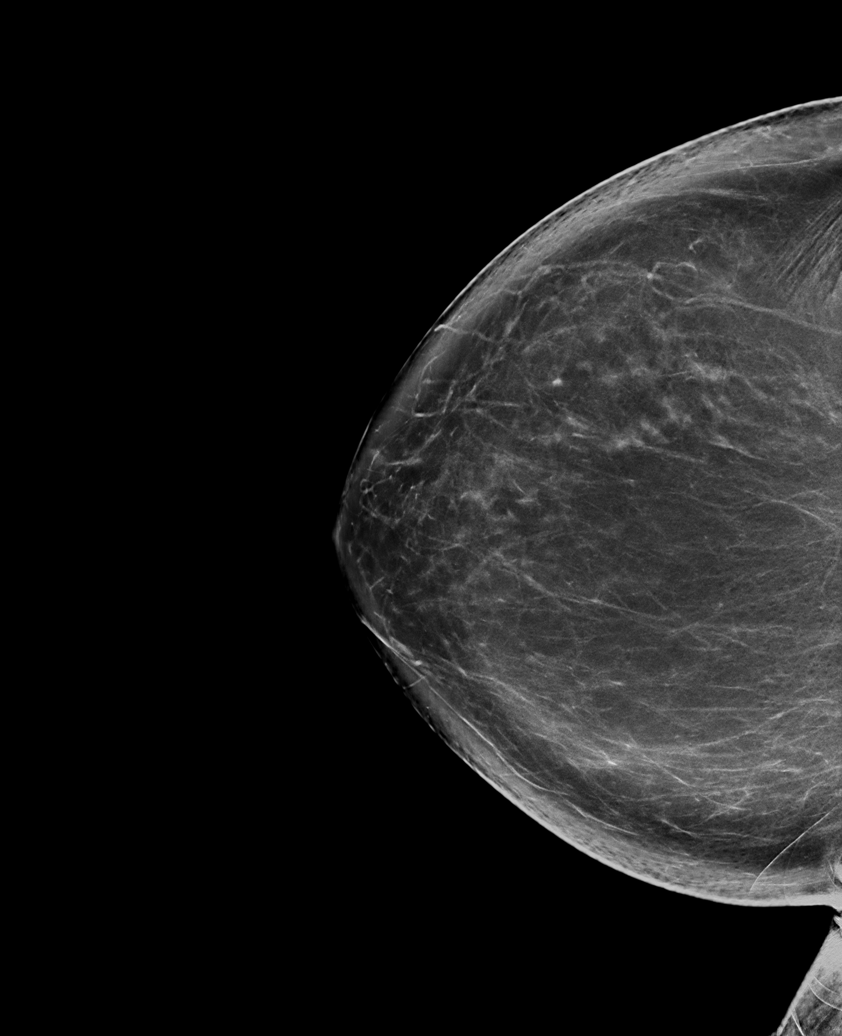

[R MLO synth-2D]
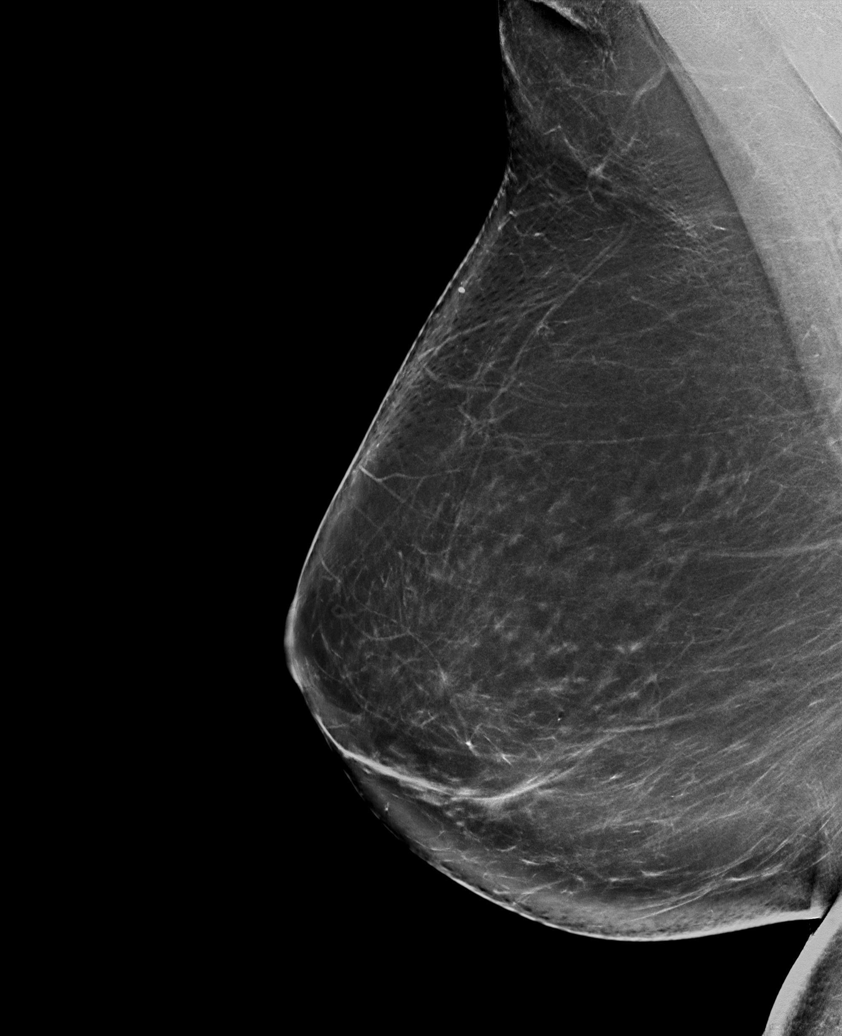

[L CC synth-2D]
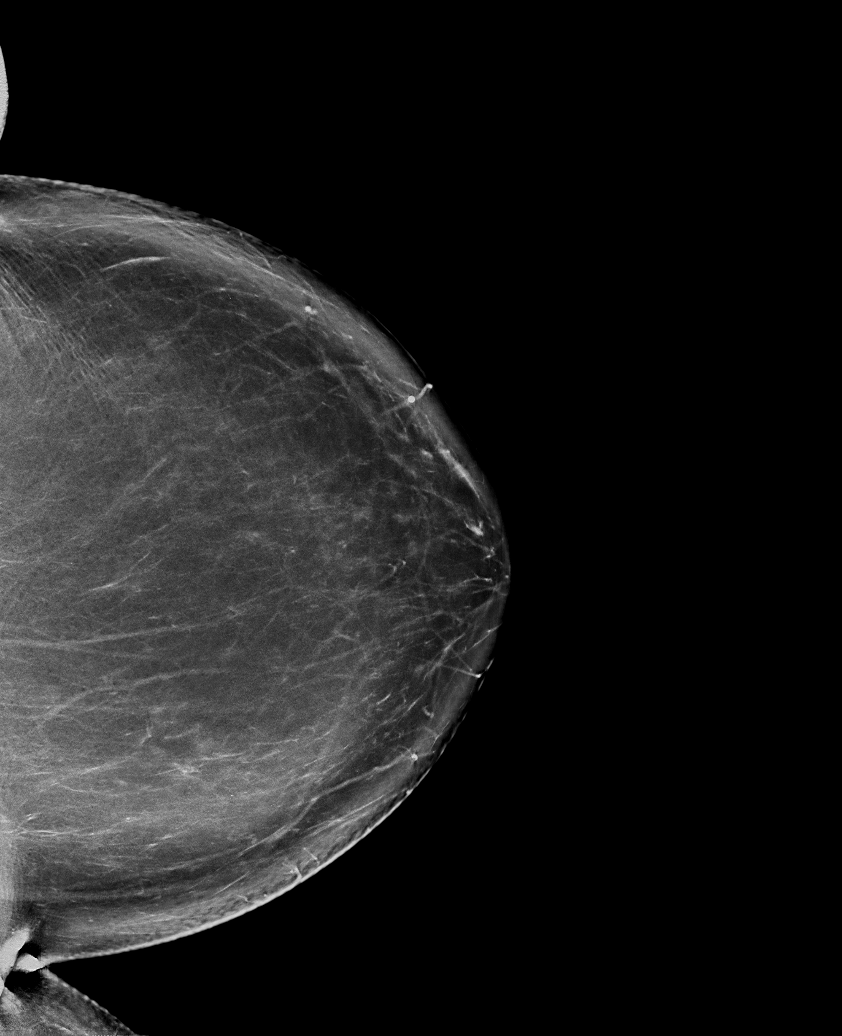

[6 of 36 positions shown; findings below may reference images not displayed]

ACR Breast Density Category b: There are scattered areas of
fibroglandular density.
FINDINGS: There are no findings suspicious for malignancy.
IMPRESSION: No mammographic evidence of malignancy. A result letter of this
screening mammogram will be mailed directly to the patient.

RECOMMENDATION:
Screening mammogram in one year. (Code:51-O-LD2)

BI-RADS CATEGORY  1: Negative.

## 2021-10-30 ENCOUNTER — Ambulatory Visit: Admit: 2021-10-30 | Payer: No Typology Code available for payment source | Admitting: Gastroenterology

## 2021-10-30 SURGERY — COLONOSCOPY WITH PROPOFOL
Anesthesia: General

## 2022-03-24 ENCOUNTER — Ambulatory Visit: Payer: No Typology Code available for payment source | Admitting: Dermatology

## 2022-03-31 ENCOUNTER — Ambulatory Visit: Payer: No Typology Code available for payment source | Admitting: Dermatology

## 2022-09-02 ENCOUNTER — Other Ambulatory Visit: Payer: Self-pay | Admitting: Student

## 2022-09-02 DIAGNOSIS — Z1231 Encounter for screening mammogram for malignant neoplasm of breast: Secondary | ICD-10-CM

## 2022-09-29 ENCOUNTER — Ambulatory Visit
Admission: RE | Admit: 2022-09-29 | Discharge: 2022-09-29 | Disposition: A | Discharge status: Home / Self Care | Payer: No Typology Code available for payment source | Source: Ambulatory Visit | Attending: Student | Admitting: Student

## 2022-09-29 DIAGNOSIS — Z1231 Encounter for screening mammogram for malignant neoplasm of breast: Secondary | ICD-10-CM | POA: Diagnosis present

## 2023-08-25 ENCOUNTER — Telehealth: Payer: Self-pay | Admitting: Student

## 2023-08-25 NOTE — Telephone Encounter (Signed)
Patient was called on 08/18/2023 to reschedule new patient appointment. Patient scheduled appointment through MyChart and appointment was schedule in a slot that was not for new patients. Patient was advised on what happened and that the front office called to reschedule to the correct slot. Patient got very upset and rude and stated that she did not want to reschedule at a office that is so incompetent.

## 2023-09-03 ENCOUNTER — Ambulatory Visit: Payer: PRIVATE HEALTH INSURANCE | Admitting: Family Medicine

## 2023-10-06 ENCOUNTER — Ambulatory Visit: Payer: Self-pay | Admitting: Physician Assistant

## 2023-10-13 ENCOUNTER — Ambulatory Visit: Payer: PRIVATE HEALTH INSURANCE | Admitting: Nurse Practitioner

## 2023-10-13 VITALS — BP 128/88 | HR 61 | Temp 98.1°F | Ht 69.0 in | Wt 222.6 lb

## 2023-10-13 DIAGNOSIS — Z1329 Encounter for screening for other suspected endocrine disorder: Secondary | ICD-10-CM

## 2023-10-13 DIAGNOSIS — Z13220 Encounter for screening for lipoid disorders: Secondary | ICD-10-CM

## 2023-10-13 DIAGNOSIS — Z0000 Encounter for general adult medical examination without abnormal findings: Secondary | ICD-10-CM

## 2023-10-13 DIAGNOSIS — Z87898 Personal history of other specified conditions: Secondary | ICD-10-CM | POA: Insufficient documentation

## 2023-10-13 DIAGNOSIS — Z131 Encounter for screening for diabetes mellitus: Secondary | ICD-10-CM | POA: Diagnosis not present

## 2023-10-13 DIAGNOSIS — Z1231 Encounter for screening mammogram for malignant neoplasm of breast: Secondary | ICD-10-CM

## 2023-10-13 DIAGNOSIS — J309 Allergic rhinitis, unspecified: Secondary | ICD-10-CM | POA: Insufficient documentation

## 2023-10-13 DIAGNOSIS — Z1211 Encounter for screening for malignant neoplasm of colon: Secondary | ICD-10-CM

## 2023-10-13 HISTORY — DX: Encounter for general adult medical examination without abnormal findings: Z00.00

## 2023-10-13 NOTE — Assessment & Plan Note (Signed)
Physical exam complete. Lab work as outlined. Will contact patient with results. Pap- due, patient will return for this. Mammogram- due, order placed. Encouraged to call to schedule this. Colonoscopy- due, patient agreeable to Cologuard. Order placed, encouraged to complete as soon as she receives it. Declined flu vaccine. Declined additional COVID vaccines.Tetanus vaccine and Shingles vaccine- due, she will get at local health department due to insurance. Declined HIV and Hep C screenings. Recommended follow ups with Dentist and Ophthalmology for annual exams. Encouraged to continue healthy diet and exercise. Return to care for pap smear, then in one year for annual exam, sooner PRN.

## 2023-10-13 NOTE — Patient Instructions (Addendum)
YOUR MAMMOGRAM IS DUE, PLEASE CALL AND GET THIS SCHEDULED! [facility]: [phone]

## 2023-10-13 NOTE — Progress Notes (Signed)
Bethanie Dicker, NP-C Phone: [phone]  Alicia Huber is a 62 y.o. female who presents today to establish care. She has no significant past medical history. She has no complaints or new concerns today.   Diet: Well balanced- decreased salt, increased vegetable, makes own bread, cooks mostly at home Exercise: [employer] 2 times per week at [employer] [facility] Pap smear: 01/20/2017- Due! Will return for this Colonoscopy: Never- Due! Has been rescheduled 3 times- would like to do a Cologuard Mammogram: 09/29/2022- Due! Family history-  Colon cancer: No  Breast cancer: No  Ovarian cancer: No Menses: Post menopausal Sexually active: Yes Vaccines-   Flu: Declined  Tetanus: 10+ years ago, unsure of exact date- will get at health department  Shingles: Interested- will get at health department  COVID19: x 2 HIV screening: Declined Hep C Screening: Declined Tobacco use: No Alcohol use: Yes, 1-2 drinks daily Illicit Drug use: No Dentist: Yes Ophthalmology: Yes   Active Ambulatory Problems    Diagnosis Date Noted   Chronic allergic rhinitis 10/13/2023   Hx of motion sickness 10/13/2023   Preventative health care 10/13/2023   Resolved Ambulatory Problems    Diagnosis Date Noted   No Resolved Ambulatory Problems   Past Medical History:  Diagnosis Date   Allergy All my life   Arthritis    Asthma    Car sickness    Cataract last year    Family History  Problem Relation Age of Onset   Breast cancer Paternal Grandmother     Social History   Socioeconomic History   Marital status: Married    Spouse name: Not on file   Number of children: Not on file   Years of education: Not on file   Highest education level: Bachelor's degree (e.g., BA, AB, BS)  Occupational History   Not on file  Tobacco Use   Smoking status: Former    Current packs/day: 0.00    Types: Cigarettes    Quit date: 12/15/1983    Years since quitting: 39.8   Smokeless tobacco: Never    Tobacco comments:    That's a guess on quit date. I stopped smoking at 23.  Substance and Sexual Activity   Alcohol use: Yes    Alcohol/week: 8.0 standard drinks of alcohol    Types: 8 Standard drinks or equivalent per week    Comment: I pour light when I drink   Drug use: Never   Sexual activity: Not Currently    Birth control/protection: Post-menopausal  Other Topics Concern   Not on file  Social History Narrative   Not on file   Social Determinants of Health   Financial Resource Strain: Patient Declined (10/13/2023)   Overall Financial Resource Strain (CARDIA)    Difficulty of Paying Living Expenses: Patient declined  Food Insecurity: Patient Declined (10/13/2023)   Hunger Vital Sign    Worried About Running Out of Food in the Last Year: Patient declined    Ran Out of Food in the Last Year: Patient declined  Transportation Needs: No Transportation Needs (10/13/2023)   PRAPARE - Administrator, Civil Service (Medical): No    Lack of Transportation (Non-Medical): No  Physical Activity: Insufficiently Active (10/13/2023)   Exercise Vital Sign    Days of Exercise per Week: 2 days    Minutes of Exercise per Session: 50 min  Stress: Patient Declined (10/13/2023)   [employer] of Occupational Health - Occupational Stress Questionnaire    Feeling of Stress :  Patient declined  Social Connections: Unknown (10/13/2023)   Social Connection and Isolation Panel [NHANES]    Frequency of Communication with Friends and Family: Patient declined    Frequency of Social Gatherings with Friends and Family: Patient declined    Attends Religious Services: Patient declined    Database administrator or Organizations: Patient declined    Attends Engineer, structural: Not on file    Marital Status: Married  Catering manager Violence: Not on file    ROS  General:  Negative for unexplained weight loss, fever Skin: Negative for new or changing mole, sore that won't  heal HEENT: Negative for trouble hearing, trouble seeing, ringing in ears, mouth sores, hoarseness, change in voice, dysphagia. CV:  Negative for chest pain, dyspnea, edema, palpitations Resp: Negative for cough, dyspnea, hemoptysis GI: Negative for nausea, vomiting, diarrhea, constipation, abdominal pain, melena, hematochezia. GU: Negative for dysuria, incontinence, urinary hesitance, hematuria, vaginal or penile discharge, polyuria, sexual difficulty, lumps in testicle or breasts MSK: Negative for muscle cramps or aches, joint pain or swelling Neuro: Negative for headaches, weakness, numbness, dizziness, passing out/fainting Psych: Negative for depression, anxiety, memory problems  Objective  Physical Exam Vitals:   10/13/23 1403  BP: 128/88  Pulse: 61  Temp: 98.1 F (36.7 C)  SpO2: 98%    BP Readings from Last 3 Encounters:  10/13/23 128/88  06/25/21 133/77  06/11/21 126/77   Wt Readings from Last 3 Encounters:  10/13/23 222 lb 9.6 oz (101 kg)  06/25/21 213 lb (96.6 kg)  06/11/21 215 lb (97.5 kg)    Physical Exam Constitutional:      General: She is not in acute distress.    Appearance: Normal appearance.  HENT:     Head: Normocephalic.     Right Ear: Tympanic membrane normal.     Left Ear: Tympanic membrane normal.     Nose: Nose normal.     Mouth/Throat:     Mouth: Mucous membranes are moist.     Pharynx: Oropharynx is clear.  Eyes:     Conjunctiva/sclera: Conjunctivae normal.     Pupils: Pupils are equal, round, and reactive to light.  Neck:     Thyroid: No thyromegaly.  Cardiovascular:     Rate and Rhythm: Normal rate and regular rhythm.     Heart sounds: Normal heart sounds.  Pulmonary:     Effort: Pulmonary effort is normal.     Breath sounds: Normal breath sounds.  Abdominal:     General: Abdomen is flat. Bowel sounds are normal.     Palpations: Abdomen is soft. There is no mass.     Tenderness: There is no abdominal tenderness.  Musculoskeletal:         General: Normal range of motion.  Lymphadenopathy:     Cervical: No cervical adenopathy.  Skin:    General: Skin is warm and dry.     Findings: No rash.  Neurological:     General: No focal deficit present.     Mental Status: She is alert.  Psychiatric:        Mood and Affect: Mood normal.        Behavior: Behavior normal.    Assessment/Plan:   Preventative health care Assessment & Plan: Physical exam complete. Lab work as outlined. Will contact patient with results. Pap- due, patient will return for this. Mammogram- due, order placed. Encouraged to call to schedule this. Colonoscopy- due, patient agreeable to Cologuard. Order placed, encouraged to complete as soon as  she receives it. Declined flu vaccine. Declined additional COVID vaccines.Tetanus vaccine and Shingles vaccine- due, she will get at local health department due to insurance. Declined HIV and Hep C screenings. Recommended follow ups with Dentist and Ophthalmology for annual exams. Encouraged to continue healthy diet and exercise. Return to care for pap smear, then in one year for annual exam, sooner PRN.   Orders: -     CBC with Differential/Platelet -     Comprehensive metabolic panel -     VITAMIN D 25 Hydroxy (Vit-D Deficiency, Fractures)  Thyroid disorder screen -     TSH  Lipid screening -     Lipid panel  Diabetes mellitus screening -     Hemoglobin A1c  Screen for colon cancer -     Cologuard  Screening mammogram for breast cancer -     3D Screening Mammogram, Left and Right; Future   Return for Pap smear, then in one year for Annual exam, sooner PRN.   Bethanie Dicker, NP-C [facility] - [employer]

## 2023-10-14 LAB — CBC WITH DIFFERENTIAL/PLATELET
Basophils Absolute: 0.1 10*3/uL (ref 0.0–0.1)
Basophils Relative: 0.8 % (ref 0.0–3.0)
Eosinophils Absolute: 0.7 10*3/uL (ref 0.0–0.7)
Eosinophils Relative: 10.4 % — ABNORMAL HIGH (ref 0.0–5.0)
HCT: 42.3 % (ref 36.0–46.0)
Hemoglobin: 13.6 g/dL (ref 12.0–15.0)
Lymphocytes Relative: 24.8 % (ref 12.0–46.0)
Lymphs Abs: 1.6 10*3/uL (ref 0.7–4.0)
MCHC: 32.2 g/dL (ref 30.0–36.0)
MCV: 98.2 fL (ref 78.0–100.0)
Monocytes Absolute: 0.6 10*3/uL (ref 0.1–1.0)
Monocytes Relative: 10.1 % (ref 3.0–12.0)
Neutro Abs: 3.5 10*3/uL (ref 1.4–7.7)
Neutrophils Relative %: 53.9 % (ref 43.0–77.0)
Platelets: 292 10*3/uL (ref 150.0–400.0)
RBC: 4.31 Mil/uL (ref 3.87–5.11)
RDW: 14.2 % (ref 11.5–15.5)
WBC: 6.4 10*3/uL (ref 4.0–10.5)

## 2023-10-14 LAB — VITAMIN D 25 HYDROXY (VIT D DEFICIENCY, FRACTURES): VITD: 24.05 ng/mL — ABNORMAL LOW (ref 30.00–100.00)

## 2023-10-14 LAB — LIPID PANEL
Cholesterol: 202 mg/dL — ABNORMAL HIGH (ref 0–200)
HDL: 113.5 mg/dL (ref >39.00)
LDL Cholesterol: 78 mg/dL (ref 0–99)
NonHDL: 88.16
Total CHOL/HDL Ratio: 2
Triglycerides: 49 mg/dL (ref 0.0–149.0)
VLDL: 9.8 mg/dL (ref 0.0–40.0)

## 2023-10-14 LAB — COMPREHENSIVE METABOLIC PANEL
ALT: 21 U/L (ref 0–35)
AST: 21 U/L (ref 0–37)
Albumin: 4.4 g/dL (ref 3.5–5.2)
Alkaline Phosphatase: 48 U/L (ref 39–117)
BUN: 16 mg/dL (ref 6–23)
CO2: 24 meq/L (ref 19–32)
Calcium: 9.8 mg/dL (ref 8.4–10.5)
Chloride: 103 meq/L (ref 96–112)
Creatinine, Ser: 0.91 mg/dL (ref 0.40–1.20)
GFR: 68.23 mL/min (ref >60.00)
Glucose, Bld: 91 mg/dL (ref 70–99)
Potassium: 4.1 meq/L (ref 3.5–5.1)
Sodium: 139 meq/L (ref 135–145)
Total Bilirubin: 0.4 mg/dL (ref 0.2–1.2)
Total Protein: 7.9 g/dL (ref 6.0–8.3)

## 2023-10-14 LAB — HEMOGLOBIN A1C: Hgb A1c MFr Bld: 6 % (ref 4.6–6.5)

## 2023-10-14 LAB — TSH: TSH: 4 u[IU]/mL (ref 0.35–5.50)

## 2023-10-20 ENCOUNTER — Other Ambulatory Visit: Payer: Self-pay | Admitting: Nurse Practitioner

## 2023-10-20 DIAGNOSIS — J0101 Acute recurrent maxillary sinusitis: Secondary | ICD-10-CM

## 2023-10-20 MED ORDER — AMOXICILLIN-POT CLAVULANATE 875-125 MG PO TABS: 1.0000 | ORAL_TABLET | Freq: Two times a day (BID) | ORAL | 0 refills | Status: DC

## 2023-10-20 NOTE — Progress Notes (Signed)
Sinus infection- sending in abx

## 2023-11-09 ENCOUNTER — Ambulatory Visit
Admission: RE | Admit: 2023-11-09 | Discharge: 2023-11-09 | Disposition: A | Discharge status: Home / Self Care | Payer: PRIVATE HEALTH INSURANCE | Source: Ambulatory Visit | Attending: Nurse Practitioner | Admitting: Nurse Practitioner

## 2023-11-09 DIAGNOSIS — Z1231 Encounter for screening mammogram for malignant neoplasm of breast: Secondary | ICD-10-CM | POA: Insufficient documentation

## 2023-11-16 ENCOUNTER — Ambulatory Visit: Payer: PRIVATE HEALTH INSURANCE | Admitting: Nurse Practitioner

## 2023-11-16 ENCOUNTER — Other Ambulatory Visit (HOSPITAL_COMMUNITY)
Admission: RE | Admit: 2023-11-16 | Discharge: 2023-11-16 | Disposition: A | Discharge status: Home / Self Care | Payer: PRIVATE HEALTH INSURANCE | Source: Ambulatory Visit | Attending: Nurse Practitioner | Admitting: Nurse Practitioner

## 2023-11-16 ENCOUNTER — Encounter: Payer: Self-pay | Admitting: Nurse Practitioner

## 2023-11-16 VITALS — BP 120/72 | HR 68 | Temp 97.8°F | Ht 69.0 in | Wt 220.6 lb

## 2023-11-16 DIAGNOSIS — N952 Postmenopausal atrophic vaginitis: Secondary | ICD-10-CM | POA: Diagnosis not present

## 2023-11-16 DIAGNOSIS — Z124 Encounter for screening for malignant neoplasm of cervix: Secondary | ICD-10-CM

## 2023-11-16 DIAGNOSIS — Z01419 Encounter for gynecological examination (general) (routine) without abnormal findings: Secondary | ICD-10-CM | POA: Insufficient documentation

## 2023-11-16 LAB — COLOGUARD: COLOGUARD: NEGATIVE

## 2023-11-16 MED ORDER — ESTROGENS CONJUGATED 0.625 MG/GM VA CREA: 1.0000 | TOPICAL_CREAM | Freq: Every day | VAGINAL | 12 refills | Status: DC

## 2023-11-16 NOTE — Assessment & Plan Note (Addendum)
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 We will trial estrogen cream vaginally to alleviate symptoms and advise continuing the use of water-soluble lubricants during intercourse. She will use a small amount (0.5 g), not an entire applicator, at bedtime x 5-7 days then decrease to twice weekly as needed. She will contact if symptoms persist despite medication.

## 2023-11-16 NOTE — Progress Notes (Signed)
Alicia Dicker, NP-C Phone: [phone]  Alicia Huber is a 62 y.o. female who presents today for pap smear.   Discussed the use of AI scribe software for clinical note transcription with the patient, who gave verbal consent to proceed.  History of Present Illness   The patient, with a history of menopause, presented for a routine Pap smear, her last one being in 2018 with no history of abnormal results. She reported experiencing vaginal itching, which she attributed to a recent course of antibiotics. She has a past history of frequent yeast infections and has been managing the current symptoms with over-the-counter Vagisil wipes. She denies any discharge. The patient noted that the itching has significantly improved and did not feel the need for further treatment at this time.  The patient also reported experiencing painful intercourse since going through menopause, describing it as a burning sensation. She has been using lubricants to manage this issue but has largely abstained from sexual activity due to the discomfort. She expressed interest in trying estrogen creams to potentially alleviate this symptom.  The patient also mentioned a recent normal mammogram and a Cologuard test she had completed the week prior.      Social History   Tobacco Use  Smoking Status Former   Current packs/day: 0.00   Types: Cigarettes   Quit date: 12/15/1983   Years since quitting: 39.9  Smokeless Tobacco Never  Tobacco Comments   That's a guess on quit date. I stopped smoking at 23.    Current Outpatient Medications on File Prior to Visit  Medication Sig Dispense Refill   Fexofenadine HCl (MUCINEX ALLERGY PO) Take by mouth as needed.     ketoconazole (NIZORAL) 2 % cream Apply 1 application topically daily as needed for irritation.     loratadine-pseudoephedrine (CLARITIN-D 24-HOUR) 10-240 MG 24 hr tablet Take 1 tablet by mouth as needed for allergies.     mometasone (ELOCON) 0.1 % lotion Apply  topically as directed. Qd to bid ears prn itchy rash 60 mL 1   naproxen sodium (ALEVE) 220 MG tablet Take 220 mg by mouth.     triamcinolone (NASACORT) 55 MCG/ACT AERO nasal inhaler Place 2 sprays into the nose as needed.     No current facility-administered medications on file prior to visit.    ROS see history of present illness  Objective  Physical Exam Vitals:   11/16/23 0832  BP: 120/72  Pulse: 68  Temp: 97.8 F (36.6 C)  SpO2: 97%    BP Readings from Last 3 Encounters:  11/16/23 120/72  10/13/23 128/88  06/25/21 133/77   Wt Readings from Last 3 Encounters:  11/16/23 220 lb 9.6 oz (100.1 kg)  10/13/23 222 lb 9.6 oz (101 kg)  06/25/21 213 lb (96.6 kg)    Physical Exam Exam conducted with a chaperone present Donavan Foil, CMA).  Constitutional:      General: She is not in acute distress.    Appearance: Normal appearance.  HENT:     Head: Normocephalic.  Cardiovascular:     Rate and Rhythm: Normal rate and regular rhythm.     Heart sounds: Normal heart sounds.  Pulmonary:     Effort: Pulmonary effort is normal.     Breath sounds: Normal breath sounds.  Genitourinary:    General: Normal vulva.     Pubic Area: No rash.      Labia:        Right: No rash or lesion.  Left: No rash or lesion.      Vagina: Normal. No vaginal discharge.     Cervix: Normal. No discharge or cervical bleeding.     Uterus: Normal.   Skin:    General: Skin is warm and dry.  Neurological:     General: No focal deficit present.     Mental Status: She is alert.  Psychiatric:        Mood and Affect: Mood normal.        Behavior: Behavior normal.    Assessment/Plan: Please see individual problem list.  Well woman exam with routine gynecological exam Assessment & Plan: Her last Pap smear was in 2018 with no history of abnormal results. A Pap smear was performed today, and we will contact her with the results. Her recent mammogram was normal. She completed a Cologuard test  on 11/08/2023, and we will contact her with the results. Routine lab work completed in October.    Post-menopausal atrophic vaginitis Assessment & Plan: Painful intercourse is likely secondary to postmenopausal vaginal atrophy. We will trial estrogen cream vaginally to alleviate symptoms and advise continuing the use of water-soluble lubricants during intercourse. She will use a small amount (0.5 g), not an entire applicator, at bedtime x 5-7 days then decrease to twice weekly as needed. She will contact if symptoms persist despite medication.   Orders: -     Estrogens Conjugated; Place 1 Applicatorful vaginally at bedtime. X 5-7 days then decrease to twice weekly as needed.  Dispense: 30 g; Refill: 12  Screening for cervical cancer -     Cytology - PAP   Return for Annual Exam as scheduled, sooner as needed.   Alicia Dicker, NP-C [facility] - [employer]

## 2023-11-16 NOTE — Assessment & Plan Note (Signed)
Her last Pap smear was in 2018 with no history of abnormal results. A Pap smear was performed today, and we will contact her with the results. Her recent mammogram was normal. She completed a Cologuard test on 11/08/2023, and we will contact her with the results. Routine lab work completed in October.

## 2023-11-18 LAB — CYTOLOGY - PAP
Chlamydia: NEGATIVE
Comment: NEGATIVE
Comment: NEGATIVE
Comment: NEGATIVE
Comment: NORMAL
Diagnosis: NEGATIVE
High risk HPV: NEGATIVE
Neisseria Gonorrhea: NEGATIVE
Trichomonas: NEGATIVE

## 2024-01-12 ENCOUNTER — Encounter: Payer: Self-pay | Admitting: Nurse Practitioner

## 2024-02-09 ENCOUNTER — Encounter: Payer: Self-pay | Admitting: Dermatology

## 2024-02-09 ENCOUNTER — Ambulatory Visit (INDEPENDENT_AMBULATORY_CARE_PROVIDER_SITE_OTHER): Payer: PRIVATE HEALTH INSURANCE | Admitting: Dermatology

## 2024-02-09 DIAGNOSIS — L820 Inflamed seborrheic keratosis: Secondary | ICD-10-CM | POA: Diagnosis not present

## 2024-02-09 DIAGNOSIS — L719 Rosacea, unspecified: Secondary | ICD-10-CM | POA: Diagnosis not present

## 2024-02-09 DIAGNOSIS — R21 Rash and other nonspecific skin eruption: Secondary | ICD-10-CM | POA: Diagnosis not present

## 2024-02-09 DIAGNOSIS — D1801 Hemangioma of skin and subcutaneous tissue: Secondary | ICD-10-CM

## 2024-02-09 DIAGNOSIS — D492 Neoplasm of unspecified behavior of bone, soft tissue, and skin: Secondary | ICD-10-CM | POA: Diagnosis not present

## 2024-02-09 MED ORDER — METRONIDAZOLE 0.75 % EX CREA: TOPICAL_CREAM | CUTANEOUS | 5 refills | Status: AC

## 2024-02-09 NOTE — Progress Notes (Signed)
 Follow-Up Visit   Subjective  Alicia Huber is a 63 y.o. female who presents for the following: Spot on left arm, right upper arm, lower legs. Dur: ~1 month. Non tender, denies itching, burns a little. Used Clobetasol, has not resolved. Similar thinner lesion at right upper arm.   The patient has spots, moles and lesions to be evaluated, some may be new or changing and the patient may have concern these could be cancer.    The following portions of the chart were reviewed this encounter and updated as appropriate: medications, allergies, medical history  Review of Systems:  No other skin or systemic complaints except as noted in HPI or Assessment and Plan.  Objective  Well appearing patient in no apparent distress; mood and affect are within normal limits.  A focused examination was performed of the following areas: Face, arms, lower legs  Relevant physical exam findings are noted in the Assessment and Plan.  Left Forearm - Posterior 1.2 cm x 0.8 pink tan thin plaque with elevated ege   Assessment & Plan   NEOPLASM OF SKIN Left Forearm - Posterior Skin / nail biopsy Type of biopsy: tangential   Informed consent: discussed and consent obtained   Anesthesia: the lesion was anesthetized in a standard fashion   Anesthesia comment:  Area prepped with alcohol Anesthetic:  1% lidocaine w/ epinephrine 1-100,000 buffered w/ 8.4% NaHCO3 Instrument used: flexible razor blade   Hemostasis achieved with: pressure, aluminum chloride and electrodesiccation   Outcome: patient tolerated procedure well   Post-procedure details: wound care instructions given   Post-procedure details comment:  Ointment and small bandage applied  Anatomic Pathology Report  ROSACEA Exam Mid face erythema with telangiectasias +/- scattered inflammatory papules at malar cheeks and chin  Chronic and persistent condition with duration or expected duration over one year. Condition is bothersome/symptomatic  for patient. Currently flared.   Rosacea is a chronic progressive skin condition usually affecting the face of adults, causing redness and/or acne bumps. It is treatable but not curable. It sometimes affects the eyes (ocular rosacea) as well. It may respond to topical and/or systemic medication and can flare with stress, sun exposure, alcohol, exercise, topical steroids (including hydrocortisone/cortisone 10) and some foods.  Daily application of broad spectrum spf 30+ sunscreen to face is recommended to reduce flares.  Patient denies grittiness of the eyes  Treatment Plan: Start Metronidazole 0.75% cream twice daily  Recommend daily broad spectrum sunscreen SPF 30+ to sun-exposed areas, reapply every 2 hours as needed. Call for new or changing lesions.  Staying in the shade or wearing long sleeves, sun glasses (UVA+UVB protection) and wide brim hats (4-inch brim around the entire circumference of the hat) are also recommended for sun protection.   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 <1% BSA.  Chronic and persistent condition with duration or expected duration over one year. Condition is symptomatic/ bothersome to patient. Not currently at goal.   patient denies joint pain  Psoriasis is a chronic non-curable, but treatable genetic/hereditary disease that may have other systemic features affecting other organ systems such as joints (Psoriatic Arthritis). It is associated with an increased risk of inflammatory bowel disease, heart disease, non-alcoholic fatty liver disease, and depression.  Treatments include light and laser treatments; topical medications; and systemic medications including oral and injectables.  Treatment Plan: Continue Clobetasol cream twice a day as needed for psoriasis/rash    Topical steroids (such as  triamcinolone, fluocinolone, fluocinonide, mometasone, clobetasol, halobetasol, betamethasone,  hydrocortisone) can cause thinning and lightening of the skin if they are used for too long in the same area. Your physician has selected the right strength medicine for your problem and area affected on the body. Please use your medication only as directed by your physician to prevent side effects.   HEMANGIOMA Exam: Left lower lip at vermilion border 1.39mm red slightly depressed macule with surrounding hypopigmentation, blanches with diascopy.  Treatment:  Discussed benign nature. Benign-appearing. Stable compared to previous visit. Observation.  Call clinic for new or changing moles.  Recommend daily use of broad spectrum spf 30+ sunscreen to sun-exposed areas.     Return for follow up pending biopsy results .  I, Lawson Radar, CMA, am acting as scribe for Willeen Niece, MD.   Documentation: I have reviewed the above documentation for accuracy and completeness, and I agree with the above.  Willeen Niece, MD

## 2024-02-09 NOTE — Patient Instructions (Addendum)
 Wound Care Instructions  Cleanse wound gently with soap and water once a day then pat dry with clean gauze. Apply a thin coat of Petrolatum (petroleum jelly, "Vaseline") over the wound (unless you have an allergy to this). We recommend that you use a new, sterile tube of Vaseline. Do not pick or remove scabs. Do not remove the yellow or white "healing tissue" from the base of the wound.  Cover the wound with fresh, clean, nonstick gauze and secure with paper tape. You may use Band-Aids in place of gauze and tape if the wound is small enough, but would recommend trimming much of the tape off as there is often too much. Sometimes Band-Aids can irritate the skin.  You should call the office for your biopsy report after 1 week if you have not already been contacted.  If you experience any problems, such as abnormal amounts of bleeding, swelling, significant bruising, significant pain, or evidence of infection, please call the office immediately.  FOR ADULT SURGERY PATIENTS: If you need something for pain relief you may take 1 extra strength Tylenol (acetaminophen) AND 2 Ibuprofen (200mg  each) together every 4 hours as needed for pain. (do not take these if you are allergic to them or if you have a reason you should not take them.) Typically, you may only need pain medication for 1 to 3 days.       Rosacea is a chronic progressive skin condition usually affecting the face of adults, causing redness and/or acne bumps. It is treatable but not curable. It sometimes affects the eyes (ocular rosacea) as well. It may respond to topical and/or systemic medication and can flare with stress, sun exposure, alcohol, exercise, topical steroids (including hydrocortisone/cortisone 10) and some foods.  Daily application of broad spectrum spf 30+ sunscreen to face is recommended to reduce flares.  Start Metronidazole 0.75% cream twice daily to face, once improved decrease to once daily.    Continue Clobetasol cream  twice a day as needed for psoriasis/rash    Topical steroids (such as triamcinolone, fluocinolone, fluocinonide, mometasone, clobetasol, halobetasol, betamethasone, hydrocortisone) can cause thinning and lightening of the skin if they are used for too long in the same area. Your physician has selected the right strength medicine for your problem and area affected on the body. Please use your medication only as directed by your physician to prevent side effects.    Due to recent changes in healthcare laws, you may see results of your pathology and/or laboratory studies on MyChart before the doctors have had a chance to review them. We understand that in some cases there may be results that are confusing or concerning to you. Please understand that not all results are received at the same time and often the doctors may need to interpret multiple results in order to provide you with the best plan of care or course of treatment. Therefore, we ask that you please give [place] 2 business days to thoroughly review all your results before contacting the office for clarification. Should we see a critical lab result, you will be contacted sooner.   If You Need Anything After Your Visit  If you have any questions or concerns for your doctor, please call our main line at [phone] and press option 4 to reach your doctor's medical assistant. If no one answers, please leave a voicemail as directed and we will return your call as soon as possible. Messages left after 4 pm will be answered the following business day.  You may also send [place] a message via MyChart. We typically respond to MyChart messages within 1-2 business days.  For prescription refills, please ask your pharmacy to contact our office. Our fax number is [phone].  If you have an urgent issue when the clinic is closed that cannot wait until the next business day, you can page your doctor at the number below.    Please note that while we do our  best to be available for urgent issues outside of office hours, we are not available 24/7.   If you have an urgent issue and are unable to reach [place], you may choose to seek medical care at your doctor's office, retail clinic, urgent care center, or emergency room.  If you have a medical emergency, please immediately call 911 or go to the emergency department.  Pager Numbers  - Dr. Gwen Pounds: [phone]  - Dr. Roseanne Reno: [phone]  - Dr. Katrinka Blazing: [phone]   In the event of inclement weather, please call our main line at [phone] for an update on the status of any delays or closures.  Dermatology Medication Tips: Please keep the boxes that topical medications come in in order to help keep track of the instructions about where and how to use these. Pharmacies typically print the medication instructions only on the boxes and not directly on the medication tubes.   If your medication is too expensive, please contact our office at [phone] option 4 or send [place] a message through MyChart.   We are unable to tell what your co-pay for medications will be in advance as this is different depending on your insurance coverage. However, we may be able to find a substitute medication at lower cost or fill out paperwork to get insurance to cover a needed medication.   If a prior authorization is required to get your medication covered by your insurance company, please allow [place] 1-2 business days to complete this process.  Drug prices often vary depending on where the prescription is filled and some pharmacies may offer cheaper prices.  The website www.goodrx.com contains coupons for medications through different pharmacies. The prices here do not account for what the cost may be with help from insurance (it may be cheaper with your insurance), but the website can give you the price if you did not use any insurance.  - You can print the associated coupon and take it with your prescription to the  pharmacy.  - You may also stop by our office during regular business hours and pick up a GoodRx coupon card.  - If you need your prescription sent electronically to a different pharmacy, notify our office through [facility] or by phone at [phone] option 4.     Si Usted Necesita Algo Despus de Su Visita  Tambin puede enviarnos un mensaje a travs de Clinical cytogeneticist. Por lo general respondemos a los mensajes de MyChart en el transcurso de 1 a 2 das hbiles.  Para renovar recetas, por favor pida a su farmacia que se ponga en contacto con nuestra oficina. Annie Sable de fax es [place] [phone].  Si tiene un asunto urgente cuando la clnica est cerrada y que no puede esperar hasta el siguiente da hbil, puede llamar/localizar a su doctor(a) al nmero que aparece a continuacin.   Por favor, tenga en cuenta que aunque hacemos todo lo posible para estar disponibles para asuntos urgentes fuera del horario de [place], no estamos disponibles las 24 horas del da, los 7 [address]  [address] de la [place].  Si tiene un problema urgente y no puede comunicarse con nosotros, puede optar por buscar atencin mdica  en el consultorio de su doctor(a), en una clnica privada, en un centro de atencin urgente o en una sala de emergencias.  Si tiene Engineer, drilling, por favor llame inmediatamente al 911 o vaya a la sala de emergencias.  Nmeros de bper  - Dr. Gwen Pounds: [phone]  - Dra. Roseanne Reno: [phone]  - Dr. Katrinka Blazing: [phone]   En caso de inclemencias del tiempo, por favor llame a Lacy Duverney principal al [phone] para una actualizacin sobre el [place] de cualquier retraso o cierre.  Consejos para la medicacin en dermatologa: Por favor, guarde las cajas en las que vienen los medicamentos de uso tpico para ayudarle a seguir las instrucciones sobre dnde y cmo usarlos. Las farmacias generalmente imprimen las instrucciones del medicamento slo en las cajas y no directamente en los  tubos del [place].   Si su medicamento es muy caro, por favor, pngase en contacto con Rolm Gala llamando al [phone] y presione la opcin 4 o envenos un mensaje a travs de Clinical cytogeneticist.   No podemos decirle cul ser su copago por los medicamentos por adelantado ya que esto es diferente dependiendo de la cobertura de su seguro. Sin embargo, es posible que podamos encontrar un medicamento sustituto a Audiological scientist un formulario para que el seguro cubra el medicamento que se considera necesario.   Si se requiere una autorizacin previa para que su compaa de seguros [place] su medicamento, por favor permtanos de 1 a 2 das hbiles para completar [address]  Los precios de los medicamentos varan con frecuencia dependiendo del Environmental consultant de dnde se surte la receta y alguna farmacias pueden ofrecer precios ms baratos.  El sitio web www.goodrx.com tiene cupones para medicamentos de Health and safety inspector. Los precios aqu no tienen en cuenta lo que podra costar con la ayuda del seguro (puede ser ms barato con su seguro), pero el sitio web puede darle el precio si no utiliz Tourist information centre manager.  - Puede imprimir el cupn correspondiente y llevarlo con su receta a la farmacia.  - Tambin puede pasar por nuestra oficina durante el horario de atencin regular y Education officer, museum una tarjeta de cupones de GoodRx.  - Si necesita que su receta se enve electrnicamente a una farmacia diferente, informe a nuestra oficina a travs de MyChart de [place] o por telfono llamando al [phone] y presione la opcin 4.

## 2024-02-15 ENCOUNTER — Telehealth: Payer: Self-pay

## 2024-02-15 LAB — ANATOMIC PATHOLOGY REPORT

## 2024-02-15 NOTE — Telephone Encounter (Signed)
-----   Message from Willeen Niece sent at 02/15/2024 11:57 AM EST ----- 1-Left Posterior Forearm,Skin Biopsy: LICHENOID KERATOSIS.    Benign inflamed keratosis, no further treatment needed- please call patient

## 2024-02-15 NOTE — Telephone Encounter (Signed)
 Advised pt of bx results/sh ?

## 2024-03-28 ENCOUNTER — Encounter: Payer: Self-pay | Admitting: Nurse Practitioner

## 2024-04-05 ENCOUNTER — Encounter: Payer: Self-pay | Admitting: Nurse Practitioner

## 2024-04-05 ENCOUNTER — Ambulatory Visit (INDEPENDENT_AMBULATORY_CARE_PROVIDER_SITE_OTHER): Payer: Self-pay | Admitting: Nurse Practitioner

## 2024-04-05 VITALS — BP 110/64 | HR 69 | Temp 97.7°F | Ht 69.0 in | Wt 219.4 lb

## 2024-04-05 DIAGNOSIS — E66811 Obesity, class 1: Secondary | ICD-10-CM

## 2024-04-05 DIAGNOSIS — Z6832 Body mass index (BMI) 32.0-32.9, adult: Secondary | ICD-10-CM

## 2024-04-05 DIAGNOSIS — E6609 Other obesity due to excess calories: Secondary | ICD-10-CM

## 2024-04-05 NOTE — Assessment & Plan Note (Addendum)
 With a BMI of 32, she is eligible for weight loss medications. Discussed the benefits and concerns of Wegovy or Zepbound, emphasizing the importance of maintaining muscle mass and bone density. Discussed the risk of nausea. I discussed that medullary thyroid cancer has been seen in rats studies. The patient confirmed no personal or family history of thyroid cancer, parathyroid cancer, or adrenal gland cancer. Discussed that we thus far have not seen medullary thyroid cancer result from use of this type of medication in humans. Consideration was given to accessing semaglutide through the [address], [address], [facility] or online sources. Post-medication lifestyle changes were also discussed. Discussed the importance of healthy diet, exercise and lifestyle modifications even with medication. Continue a high protein, low carbohydrate diet and encourage regular exercise, including weight-bearing activities. She will contact her insurance to see if medication is covered or look into other options as discussed. Monitor for potential side effects if starting on medication.

## 2024-04-05 NOTE — Progress Notes (Signed)
 Bluford Burkitt, NP-C Phone: [phone]  Alicia Huber is a 63 y.o. female who presents today for weight management.  Discussed the use of AI scribe software for clinical note transcription with the patient, who gave verbal consent to proceed.  History of Present Illness   Alicia Huber is a 63 year old female who presents with weight management concerns.  She has been unable to lose weight despite efforts to improve her diet and exercise routine. Her weight has remained stable at 215-220 pounds for the past four years. She attended the gym for three years, two days a week, but stopped last fall due to time constraints.  Her diet consists of protein and vegetables with reduced portion sizes and limited snacking. She has cut back on alcohol consumption, only drinking on weekends. Breakfast typically includes a homemade yeast roll with cheese, ham, and egg, while lunch might be a meat and cheese roll-up or a tortilla with meat and cheese.  She is concerned about the potential side effects of weight loss medications, particularly those affecting the thyroid, as her thyroid has always been enlarged. However, there is no personal or family history of thyroid cancer or issues.  Social History   Tobacco Use  Smoking Status Former   Current packs/day: 0.00   Types: Cigarettes   Quit date: 12/15/1983   Years since quitting: 40.3  Smokeless Tobacco Never  Tobacco Comments   That's a guess on quit date. I stopped smoking at 23.    Current Outpatient Medications on File Prior to Visit  Medication Sig Dispense Refill   Fexofenadine HCl (MUCINEX ALLERGY PO) Take by mouth as needed.     ketoconazole  (NIZORAL ) 2 % cream Apply 1 application topically daily as needed for irritation.     loratadine-pseudoephedrine (CLARITIN-D 24-HOUR) 10-240 MG 24 hr tablet Take 1 tablet by mouth as needed for allergies.     metroNIDAZOLE  (METROCREAM ) 0.75 % cream Apply twice daily to face 45 g 5   mometasone   (ELOCON ) 0.1 % lotion Apply topically as directed. Qd to bid ears prn itchy rash 60 mL 1   naproxen  sodium (ALEVE ) 220 MG tablet Take 220 mg by mouth.     triamcinolone  (NASACORT ) 55 MCG/ACT AERO nasal inhaler Place 2 sprays into the nose as needed.     No current facility-administered medications on file prior to visit.     ROS see history of present illness  Objective  Physical Exam Vitals:   04/05/24 1056  BP: 110/64  Pulse: 69  Temp: 97.7 F (36.5 C)  SpO2: 99%    BP Readings from Last 3 Encounters:  04/05/24 110/64  11/16/23 120/72  10/13/23 128/88   Wt Readings from Last 3 Encounters:  04/05/24 219 lb 6.4 oz (99.5 kg)  11/16/23 220 lb 9.6 oz (100.1 kg)  10/13/23 222 lb 9.6 oz (101 kg)    Physical Exam Constitutional:      General: She is not in acute distress.    Appearance: Normal appearance.  HENT:     Head: Normocephalic.  Cardiovascular:     Rate and Rhythm: Normal rate and regular rhythm.     Heart sounds: Normal heart sounds.  Pulmonary:     Effort: Pulmonary effort is normal.     Breath sounds: Normal breath sounds.  Skin:    General: Skin is warm and dry.  Neurological:     General: No focal deficit present.     Mental Status: She is alert.  Psychiatric:  Mood and Affect: Mood normal.        Behavior: Behavior normal.      Assessment/Plan: Please see individual problem list.  Class 1 obesity due to excess calories without serious comorbidity with body mass index (BMI) of 32.0 to 32.9 in adult Assessment & Plan: With a BMI of 32, she is eligible for weight loss medications. Discussed the benefits and concerns of Wegovy or Zepbound, emphasizing the importance of maintaining muscle mass and bone density. Discussed the risk of nausea. I discussed that medullary thyroid cancer has been seen in rats studies. The patient confirmed no personal or family history of thyroid cancer, parathyroid cancer, or adrenal gland cancer. Discussed that we  thus far have not seen medullary thyroid cancer result from use of this type of medication in humans. Consideration was given to accessing semaglutide through the [address], [address], [facility] or online sources. Post-medication lifestyle changes were also discussed. Discussed the importance of healthy diet, exercise and lifestyle modifications even with medication. Continue a high protein, low carbohydrate diet and encourage regular exercise, including weight-bearing activities. She will contact her insurance to see if medication is covered or look into other options as discussed. Monitor for potential side effects if starting on medication.       Return in 6 months (on 10/13/2024) for as scheduled, sooner as needed.   Bluford Burkitt, NP-C [facility]

## 2024-04-24 ENCOUNTER — Encounter: Payer: Self-pay | Admitting: Nurse Practitioner

## 2024-04-25 ENCOUNTER — Ambulatory Visit: Payer: Self-pay | Admitting: Nurse Practitioner

## 2024-04-25 DIAGNOSIS — Z131 Encounter for screening for diabetes mellitus: Secondary | ICD-10-CM

## 2024-04-25 NOTE — Telephone Encounter (Signed)
 Pended A1c for retest?

## 2024-04-27 ENCOUNTER — Other Ambulatory Visit (INDEPENDENT_AMBULATORY_CARE_PROVIDER_SITE_OTHER): Payer: PRIVATE HEALTH INSURANCE

## 2024-04-27 DIAGNOSIS — Z131 Encounter for screening for diabetes mellitus: Secondary | ICD-10-CM

## 2024-04-27 NOTE — Progress Notes (Signed)
 Aaron Aas

## 2024-04-28 LAB — HEMOGLOBIN A1C: Hgb A1c MFr Bld: 5.9 % (ref 4.6–6.5)

## 2024-05-02 ENCOUNTER — Ambulatory Visit: Payer: Self-pay | Admitting: Nurse Practitioner

## 2024-08-27 ENCOUNTER — Encounter: Payer: Self-pay | Admitting: Nurse Practitioner

## 2024-08-30 ENCOUNTER — Ambulatory Visit (INDEPENDENT_AMBULATORY_CARE_PROVIDER_SITE_OTHER): Payer: PRIVATE HEALTH INSURANCE

## 2024-08-30 DIAGNOSIS — L219 Seborrheic dermatitis, unspecified: Secondary | ICD-10-CM

## 2024-08-30 DIAGNOSIS — D229 Melanocytic nevi, unspecified: Secondary | ICD-10-CM

## 2024-08-30 DIAGNOSIS — W908XXA Exposure to other nonionizing radiation, initial encounter: Secondary | ICD-10-CM

## 2024-08-30 DIAGNOSIS — L814 Other melanin hyperpigmentation: Secondary | ICD-10-CM

## 2024-08-30 DIAGNOSIS — Z1283 Encounter for screening for malignant neoplasm of skin: Secondary | ICD-10-CM

## 2024-08-30 DIAGNOSIS — L578 Other skin changes due to chronic exposure to nonionizing radiation: Secondary | ICD-10-CM

## 2024-08-30 DIAGNOSIS — L570 Actinic keratosis: Secondary | ICD-10-CM

## 2024-08-30 DIAGNOSIS — L918 Other hypertrophic disorders of the skin: Secondary | ICD-10-CM

## 2024-08-30 DIAGNOSIS — L821 Other seborrheic keratosis: Secondary | ICD-10-CM

## 2024-08-30 DIAGNOSIS — D1801 Hemangioma of skin and subcutaneous tissue: Secondary | ICD-10-CM

## 2024-08-30 MED ORDER — CLOBETASOL PROPIONATE 0.05 % EX SOLN: CUTANEOUS | 5 refills | Status: AC

## 2024-08-30 MED ORDER — KETOCONAZOLE 2 % EX SHAM: MEDICATED_SHAMPOO | CUTANEOUS | 5 refills | Status: AC

## 2024-08-30 NOTE — Patient Instructions (Signed)

## 2024-08-30 NOTE — Progress Notes (Signed)
 Subjective   Alicia Huber is a 63 y.o. female who presents for the following: Lesion(s) of concern . Patient is established patient   Today patient reports: Spots at shoulders, lump at right face. No itching or bleeding. Nothing sore.   Review of Systems:    No other skin or systemic complaints except as noted in HPI or Assessment and Plan.  The following portions of the chart were reviewed this encounter and updated as appropriate: medications, allergies, medical history  Relevant Medical History:  n/a   Objective  Well appearing patient in no apparent distress; mood and affect are within normal limits. Examination was performed of the: Waist Up Skin Exam: scalp, head, eyes, ears, nose, lips, neck, chest, axillae, upper extremities, abdomen, back, hands, fingers, fingernails   Examination notable for: Angioma(s): Scattered red vascular papule(s)  , Lentigo/lentigines: Scattered pigmented macules that are tan to brown in color and are somewhat non-uniform in shape and concentrated in the sun-exposed areas, Nevus/nevi: Scattered well-demarcated, regular, pigmented macule(s) and/or papule(s)  , Seborrheic Keratosis(es): Stuck-on appearing keratotic papule(s) on the trunk, some  irritated with redness, crusting, edema, and/or partial avulsion, Actinic Damage/Elastosis: chronic sun damage: dyspigmentation, telangiectasia, and wrinkling, Actinic keratosis: Scaly erythematous macule(s) concentrated on sun exposed areas , Acrochordon(s): soft, fleshy, skin-colored to tan pedunculated papules located on the axilla, Seborrheic Dermatitis: Erythema and scaling of the scalp, ear canals, and central face > rest of face.  - Acrochordon(s): soft, fleshy, skin-colored to tan pedunculated papules located on the axilla  - Erythema and scaling of the scalp, ear canals, and central face > rest of face. - Multiple scaly erythematous macules consistent with actinic keratoses located on face Examination  limited by: Undergarments, Clothing, and Patient deferred removal     L cheek x 1, nose x 1 (2) Erythematous thin papules/macules with gritty scale.   Assessment & Plan   SKIN CANCER SCREENING PERFORMED TODAY.  BENIGN SKIN FINDINGS  - Lentigines  - Seborrheic keratoses  - Hemangiomas   - Nevus/Multiple Benign Nevi - Reassurance provided regarding the benign appearance of lesions noted on exam today; no treatment is indicated in the absence of symptoms/changes. - Reinforced importance of photoprotective strategies including liberal and frequent sunscreen use of a broad-spectrum SPF 30 or greater, use of protective clothing, and sun avoidance for prevention of cutaneous malignancy and photoaging.  Counseled patient on the importance of regular self-skin monitoring as well as routine clinical skin examinations as scheduled.   ACTINIC DAMAGE - Chronic condition, secondary to cumulative UV/sun exposure - Recommend daily broad spectrum sunscreen SPF 30+ to sun-exposed areas, reapply every 2 hours as needed.  - Staying in the shade or wearing long sleeves, sun glasses (UVA+UVB protection) and wide brim hats (4-inch brim around the entire circumference of the hat) are also recommended for sun protection.  - Call for new or changing lesions.  Seborrheic dermatitis Chronic and persistent condition with duration or expected duration over one year. Condition is symptomatic and bothersome to patient. Patient is flaring and not currently at treatment goal.  - Discussed diagnosis, typical course, and treatment options for this condition - Explained to the patient the chronic nature of this diagnosis - Start ketoconazole  2% shampoo TIW, apply to scalp and affected areas of face/body and allow the shampoo to sit for 5-10 minutes before rinsing off - Start clobetasol  solution 0.05% twice daily to affected skin - Discussed side effect of super potent topical steroids including atrophy, dyspigmentation,  striae, telangectasia,  folliculitis, loss of skin pigment, hair growth, tachyphylaxis, risk of systemic absorption with missuse.   Procedures, orders, diagnosis for this visit:  AK (ACTINIC KERATOSIS) (2) L cheek x 1, nose x 1 (2) Actinic keratoses are precancerous spots that appear secondary to cumulative UV radiation exposure/sun exposure over time. They are chronic with expected duration over 1 year. A portion of actinic keratoses will progress to squamous cell carcinoma of the skin. It is not possible to reliably predict which spots will progress to skin cancer and so treatment is recommended to prevent development of skin cancer.  Recommend daily broad spectrum sunscreen SPF 30+ to sun-exposed areas, reapply every 2 hours as needed.  Recommend staying in the shade or wearing long sleeves, sun glasses (UVA+UVB protection) and wide brim hats (4-inch brim around the entire circumference of the hat). Call for new or changing lesions. Destruction of lesion - L cheek x 1, nose x 1 (2) Complexity: simple   Destruction method: cryotherapy   Informed consent: discussed and consent obtained   Timeout:  patient name, date of birth, surgical site, and procedure verified Lesion destroyed using liquid nitrogen: Yes   Region frozen until ice ball extended beyond lesion: Yes   Cryo cycles: 1 or 2. Outcome: patient tolerated procedure well with no complications   Post-procedure details: wound care instructions given     AK (actinic keratosis) -     Destruction of lesion  Other orders -     Ketoconazole ; Apply to scalp as a shampoo 2-3 times weekly. Let sit for 5 minutes before rinsing.  Dispense: 120 mL; Refill: 5 -     Clobetasol  Propionate; Apply 1 mL to affected areas of scalp twice daily prn. Avoid applying to face, groin, and axilla. Use as directed. Long-term use can cause thinning of the skin.  Dispense: 50 mL; Refill: 5    Return to clinic: Return if symptoms worsen or fail to  improve.  [name], RMA, am acting as scribe for Lauraine JAYSON Kanaris, MD .   Documentation: I have reviewed the above documentation for accuracy and completeness, and I agree with the above.  Lauraine JAYSON Kanaris, MD

## 2024-10-06 ENCOUNTER — Encounter: Payer: Self-pay | Admitting: Nurse Practitioner

## 2024-10-09 ENCOUNTER — Other Ambulatory Visit: Payer: Self-pay | Admitting: Nurse Practitioner

## 2024-10-09 DIAGNOSIS — Z1231 Encounter for screening mammogram for malignant neoplasm of breast: Secondary | ICD-10-CM

## 2024-10-13 ENCOUNTER — Encounter: Payer: Self-pay | Admitting: Nurse Practitioner

## 2024-10-13 ENCOUNTER — Telehealth: Payer: Self-pay

## 2024-10-13 ENCOUNTER — Ambulatory Visit (INDEPENDENT_AMBULATORY_CARE_PROVIDER_SITE_OTHER): Payer: PRIVATE HEALTH INSURANCE | Admitting: Nurse Practitioner

## 2024-10-13 VITALS — BP 110/78 | HR 80 | Temp 98.4°F | Ht 69.0 in | Wt 174.0 lb

## 2024-10-13 DIAGNOSIS — Z1329 Encounter for screening for other suspected endocrine disorder: Secondary | ICD-10-CM | POA: Diagnosis not present

## 2024-10-13 DIAGNOSIS — J309 Allergic rhinitis, unspecified: Secondary | ICD-10-CM

## 2024-10-13 DIAGNOSIS — E559 Vitamin D deficiency, unspecified: Secondary | ICD-10-CM | POA: Diagnosis not present

## 2024-10-13 DIAGNOSIS — E663 Overweight: Secondary | ICD-10-CM

## 2024-10-13 DIAGNOSIS — Z0000 Encounter for general adult medical examination without abnormal findings: Secondary | ICD-10-CM | POA: Diagnosis not present

## 2024-10-13 DIAGNOSIS — R7303 Prediabetes: Secondary | ICD-10-CM | POA: Diagnosis not present

## 2024-10-13 DIAGNOSIS — Z13220 Encounter for screening for lipoid disorders: Secondary | ICD-10-CM | POA: Diagnosis not present

## 2024-10-13 LAB — COMPREHENSIVE METABOLIC PANEL WITH GFR
ALT: 23 U/L (ref 0–35)
AST: 21 U/L (ref 0–37)
Albumin: 4.7 g/dL (ref 3.5–5.2)
Alkaline Phosphatase: 43 U/L (ref 39–117)
BUN: 19 mg/dL (ref 6–23)
CO2: 27 meq/L (ref 19–32)
Calcium: 10.2 mg/dL (ref 8.4–10.5)
Chloride: 99 meq/L (ref 96–112)
Creatinine, Ser: 0.94 mg/dL (ref 0.40–1.20)
GFR: 65.16 mL/min (ref >60.00)
Glucose, Bld: 81 mg/dL (ref 70–99)
Potassium: 4 meq/L (ref 3.5–5.1)
Sodium: 136 meq/L (ref 135–145)
Total Bilirubin: 0.6 mg/dL (ref 0.2–1.2)
Total Protein: 7.8 g/dL (ref 6.0–8.3)

## 2024-10-13 LAB — CBC WITH DIFFERENTIAL/PLATELET
Basophils Absolute: 0 K/uL (ref 0.0–0.1)
Basophils Relative: 0.8 % (ref 0.0–3.0)
Eosinophils Absolute: 0.6 K/uL (ref 0.0–0.7)
Eosinophils Relative: 10.6 % — ABNORMAL HIGH (ref 0.0–5.0)
HCT: 40.4 % (ref 36.0–46.0)
Hemoglobin: 13.4 g/dL (ref 12.0–15.0)
Lymphocytes Relative: 28.3 % (ref 12.0–46.0)
Lymphs Abs: 1.7 K/uL (ref 0.7–4.0)
MCHC: 33.2 g/dL (ref 30.0–36.0)
MCV: 95.5 fl (ref 78.0–100.0)
Monocytes Absolute: 0.5 K/uL (ref 0.1–1.0)
Monocytes Relative: 8.4 % (ref 3.0–12.0)
Neutro Abs: 3.2 K/uL (ref 1.4–7.7)
Neutrophils Relative %: 51.9 % (ref 43.0–77.0)
Platelets: 297 K/uL (ref 150.0–400.0)
RBC: 4.23 Mil/uL (ref 3.87–5.11)
RDW: 13.3 % (ref 11.5–15.5)
WBC: 6.1 K/uL (ref 4.0–10.5)

## 2024-10-13 LAB — VITAMIN D 25 HYDROXY (VIT D DEFICIENCY, FRACTURES): VITD: 37.93 ng/mL (ref 30.00–100.00)

## 2024-10-13 LAB — LIPID PANEL
Cholesterol: 212 mg/dL — ABNORMAL HIGH (ref 0–200)
HDL: 96.6 mg/dL (ref >39.00)
LDL Cholesterol: 103 mg/dL — ABNORMAL HIGH (ref 0–99)
NonHDL: 115.02
Total CHOL/HDL Ratio: 2
Triglycerides: 60 mg/dL (ref 0.0–149.0)
VLDL: 12 mg/dL (ref 0.0–40.0)

## 2024-10-13 LAB — TSH: TSH: 6.92 u[IU]/mL — ABNORMAL HIGH (ref 0.35–5.50)

## 2024-10-13 LAB — HEMOGLOBIN A1C: Hgb A1c MFr Bld: 5.6 % (ref 4.6–6.5)

## 2024-10-13 NOTE — Progress Notes (Signed)
 Leron Glance, NP-C Phone: [phone]  Alicia Huber is a 63 y.o. female who presents today for annual exam.   Discussed the use of AI scribe software for clinical note transcription with the patient, who gave verbal consent to proceed.  History of Present Illness   Alicia Huber is a 63 year old female who presents for an annual physical exam.  She has experienced significant weight loss of approximately 50 pounds, attributed to the use of semaglutide, which she obtains through [employer]. She is currently taking 0.8 mg weekly. She experiences occasional nausea if she does not eat when hungry but otherwise tolerates the medication well. Her diet has improved, focusing on smaller portions and healthier choices, such as vegetables and lean meats. She drinks ample water and has reduced her alcohol intake, noting that semaglutide has decreased her capacity to consume alcohol.  Her exercise routine was initially disrupted by cold weather, but she has rejoined a gym. She was previously walking almost a mile daily and has resumed gym activities, although she experienced soreness after her first session back.  She has a history of prediabetes, and her last recorded weight was 222 pounds in April. She now weighs approximately 174 pounds on her home scale and 173 pounds on the office scale. She is taking vitamin D  supplements due to previously low levels.  She experiences occasional constipation, for which she uses Miralax as needed. No chest pain, shortness of breath, or abdominal pain. She denies any burning during urination, abnormal discharge, or painful sex, although she notes that intercourse is painful and thus avoided. No headaches, dizziness, or trouble swallowing, although she occasionally experiences difficulty with food getting stuck, which she attributes to not chewing thoroughly.  Her sleep is occasionally disrupted by her dogs, but she generally sleeps well and feels good. She notes  an improvement in her snoring since losing weight. She denies any mood issues, anxiety, or depression.  She is up to date on her pap smear, colonoscopy, and mammogram, with the latter scheduled for December. She has received two COVID vaccines but is unsure about her tetanus status. She is interested in the shingles vaccine but has not yet received it due to cost concerns.      Social History   Tobacco Use  Smoking Status Former   Current packs/day: 0.00   Types: Cigarettes   Quit date: 12/15/1983   Years since quitting: 40.8  Smokeless Tobacco Never  Tobacco Comments   That's a guess on quit date. I stopped smoking at 23.    Current Outpatient Medications on File Prior to Visit  Medication Sig Dispense Refill   clobetasol  (TEMOVATE ) 0.05 % external solution Apply 1 mL to affected areas of scalp twice daily prn. Avoid applying to face, groin, and axilla. Use as directed. Long-term use can cause thinning of the skin. 50 mL 5   ketoconazole  (NIZORAL ) 2 % cream Apply 1 application topically daily as needed for irritation.     ketoconazole  (NIZORAL ) 2 % shampoo Apply to scalp as a shampoo 2-3 times weekly. Let sit for 5 minutes before rinsing. 120 mL 5   loratadine-pseudoephedrine (CLARITIN-D 24-HOUR) 10-240 MG 24 hr tablet Take 1 tablet by mouth as needed for allergies.     metroNIDAZOLE  (METROCREAM ) 0.75 % cream Apply twice daily to face 45 g 5   mometasone  (ELOCON ) 0.1 % lotion Apply topically as directed. Qd to bid ears prn itchy rash 60 mL 1   naproxen  sodium (ALEVE ) 220 MG  tablet Take 220 mg by mouth.     triamcinolone  (NASACORT ) 55 MCG/ACT AERO nasal inhaler Place 2 sprays into the nose as needed.     No current facility-administered medications on file prior to visit.     ROS see history of present illness  Objective  Physical Exam Vitals:   10/13/24 1304  BP: 110/78  Pulse: 80  Temp: 98.4 F (36.9 C)  SpO2: 96%    BP Readings from Last 3 Encounters:  10/13/24 110/78   04/05/24 110/64  11/16/23 120/72   Wt Readings from Last 3 Encounters:  10/13/24 174 lb (78.9 kg)  04/05/24 219 lb 6.4 oz (99.5 kg)  11/16/23 220 lb 9.6 oz (100.1 kg)    Physical Exam Constitutional:      General: She is not in acute distress.    Appearance: Normal appearance.  HENT:     Head: Normocephalic.     Right Ear: Tympanic membrane normal.     Left Ear: Tympanic membrane normal.     Nose: Nose normal.     Mouth/Throat:     Mouth: Mucous membranes are moist.     Pharynx: Oropharynx is clear.  Eyes:     Conjunctiva/sclera: Conjunctivae normal.     Pupils: Pupils are equal, round, and reactive to light.  Neck:     Thyroid: No thyromegaly.  Cardiovascular:     Rate and Rhythm: Normal rate and regular rhythm.     Heart sounds: Normal heart sounds.  Pulmonary:     Effort: Pulmonary effort is normal.     Breath sounds: Normal breath sounds.  Abdominal:     General: Abdomen is flat. Bowel sounds are normal.     Palpations: Abdomen is soft. There is no mass.     Tenderness: There is no abdominal tenderness.  Musculoskeletal:        General: Normal range of motion.  Lymphadenopathy:     Cervical: No cervical adenopathy.  Skin:    General: Skin is warm and dry.     Findings: No rash.  Neurological:     General: No focal deficit present.     Mental Status: She is alert.  Psychiatric:        Mood and Affect: Mood normal.        Behavior: Behavior normal.      Assessment/Plan: Please see individual problem list.  Preventative health care Assessment & Plan: Physical exam complete. We will check lab work as outlined. Her Pap smear and Cologuard are up-to-date, and she has an upcoming mammogram. She declined the flu shot and shingles vaccines today due to an upcoming trade show. She will get at her convenience. Tetanus vaccine is up to date. Declines additional COVID vaccines. Continue routine dental and eye exams. She should continue her current diet and exercise  regimen. Return to care in one year, sooner as needed.   Orders: -     CBC with Differential/Platelet -     Comprehensive metabolic panel with GFR  Prediabetes Assessment & Plan: Her prediabetes is likely improving with significant weight loss. A1c will be checked to assess current glycemic control. Encourage continued healthy diet and regular exercise.   Orders: -     Hemoglobin A1c  Overweight (BMI 25.0-29.9) Assessment & Plan: Her obesity is managed with semaglutide 0.8 mg weekly, resulting in a significant weight loss of approximately 50 pounds. She experiences occasional nausea if meals are skipped, and semaglutide has reduced her alcohol consumption. Potential esophageal spasms or  stricture were discussed as differentials for occasional swallowing difficulties. Emphasis was placed on maintaining hydration and muscle mass to prevent osteoporosis. She should continue semaglutide 0.8 mg weekly and monitor for side effects. Adequate hydration and protein intake are encouraged. Weight stable. Encourage continued healthy diet and regular exercise.    Chronic allergic rhinitis Assessment & Plan: Her allergic rhinitis is currently managed with Claritin D, with symptoms exacerbated by current weather conditions. She should continue Claritin D for allergy management.    Vitamin D  deficiency Assessment & Plan: She has a history of vitamin D  deficiency and is currently taking vitamin D  supplements. The importance of vitamin D  in maintaining bone health was discussed. She should continue vitamin D  supplementation. Check vitamin D  level today.   Orders: -     VITAMIN D  25 Hydroxy (Vit-D Deficiency, Fractures)  Lipid screening -     Lipid panel  Thyroid disorder screen -     TSH     Return in about 1 year (around 10/13/2025) for Annual Exam, sooner as needed.   Leron Glance, NP-C [facility]

## 2024-10-13 NOTE — Telephone Encounter (Signed)
 Patient saw Leron Glance, FNP-C, today and Leron would like to see her again in a year for her annual exam.  Patient states she will call back to schedule her appointment for her annual exam next year.

## 2024-10-20 ENCOUNTER — Ambulatory Visit: Payer: Self-pay | Admitting: Nurse Practitioner

## 2024-10-20 DIAGNOSIS — R7989 Other specified abnormal findings of blood chemistry: Secondary | ICD-10-CM

## 2024-10-25 ENCOUNTER — Encounter: Payer: Self-pay | Admitting: Nurse Practitioner

## 2024-10-25 NOTE — Assessment & Plan Note (Signed)
 She has a history of vitamin D  deficiency and is currently taking vitamin D  supplements. The importance of vitamin D  in maintaining bone health was discussed. She should continue vitamin D  supplementation. Check vitamin D  level today.

## 2024-10-25 NOTE — Assessment & Plan Note (Signed)
 Physical exam complete. We will check lab work as outlined. Her Pap smear and Cologuard are up-to-date, and she has an upcoming mammogram. She declined the flu shot and shingles vaccines today due to an upcoming trade show. She will get at her convenience. Tetanus vaccine is up to date. Declines additional COVID vaccines. Continue routine dental and eye exams. She should continue her current diet and exercise regimen. Return to care in one year, sooner as needed.

## 2024-10-25 NOTE — Assessment & Plan Note (Signed)
 Her prediabetes is likely improving with significant weight loss. A1c will be checked to assess current glycemic control. Encourage continued healthy diet and regular exercise.

## 2024-10-25 NOTE — Assessment & Plan Note (Signed)
 Her obesity is managed with semaglutide 0.8 mg weekly, resulting in a significant weight loss of approximately 50 pounds. She experiences occasional nausea if meals are skipped, and semaglutide has reduced her alcohol consumption. Potential esophageal spasms or stricture were discussed as differentials for occasional swallowing difficulties. Emphasis was placed on maintaining hydration and muscle mass to prevent osteoporosis. She should continue semaglutide 0.8 mg weekly and monitor for side effects. Adequate hydration and protein intake are encouraged. Weight stable. Encourage continued healthy diet and regular exercise.

## 2024-10-25 NOTE — Assessment & Plan Note (Signed)
 Her allergic rhinitis is currently managed with Claritin D, with symptoms exacerbated by current weather conditions. She should continue Claritin D for allergy management.

## 2024-11-01 ENCOUNTER — Ambulatory Visit: Payer: PRIVATE HEALTH INSURANCE | Admitting: Nurse Practitioner

## 2024-11-14 ENCOUNTER — Ambulatory Visit
Admission: RE | Admit: 2024-11-14 | Discharge: 2024-11-14 | Disposition: A | Discharge status: Home / Self Care | Payer: PRIVATE HEALTH INSURANCE | Source: Ambulatory Visit | Attending: Nurse Practitioner | Admitting: Nurse Practitioner

## 2024-11-14 DIAGNOSIS — Z1231 Encounter for screening mammogram for malignant neoplasm of breast: Secondary | ICD-10-CM | POA: Insufficient documentation

## 2024-11-21 ENCOUNTER — Ambulatory Visit: Payer: Self-pay | Admitting: Nurse Practitioner

## 2024-12-15 ENCOUNTER — Other Ambulatory Visit: Payer: PRIVATE HEALTH INSURANCE

## 2024-12-15 DIAGNOSIS — R7989 Other specified abnormal findings of blood chemistry: Secondary | ICD-10-CM

## 2024-12-15 LAB — TSH: TSH: 6.62 u[IU]/mL — ABNORMAL HIGH (ref 0.35–5.50)

## 2024-12-17 ENCOUNTER — Ambulatory Visit: Payer: Self-pay | Admitting: Family
